# Patient Record
Sex: Male | Born: 1965 | Race: Black or African American | Hispanic: No | Marital: Single | Smoking: Former smoker
Health system: Southern US, Community
[De-identification: ages and names within clinical notes are randomized; demographics above are authoritative.]

---

## 2007-10-11 ENCOUNTER — Emergency Department: Payer: Self-pay | Admitting: Emergency Medicine

## 2014-02-28 ENCOUNTER — Ambulatory Visit: Payer: Self-pay | Admitting: Family Medicine

## 2014-09-14 ENCOUNTER — Ambulatory Visit (INDEPENDENT_AMBULATORY_CARE_PROVIDER_SITE_OTHER): Payer: Self-pay | Admitting: Endocrinology

## 2014-09-14 ENCOUNTER — Encounter: Payer: Self-pay | Admitting: Endocrinology

## 2014-09-14 VITALS — BP 122/68 | HR 112 | Temp 98.7°F | Ht 72.0 in | Wt 205.0 lb

## 2014-09-14 DIAGNOSIS — E0590 Thyrotoxicosis, unspecified without thyrotoxic crisis or storm: Secondary | ICD-10-CM

## 2014-09-14 MED ORDER — METOPROLOL TARTRATE 25 MG PO TABS: 25.0000 mg | ORAL_TABLET | Freq: Two times a day (BID) | ORAL | Status: DC

## 2014-09-14 MED ORDER — METHIMAZOLE 10 MG PO TABS: 20.0000 mg | ORAL_TABLET | Freq: Two times a day (BID) | ORAL | Status: DC

## 2014-09-14 NOTE — Progress Notes (Signed)
Subjective:    Patient ID: Colin [name], male    DOB: 1965/07/06, 49 y.o.   MRN: [id number]  HPI Pt reports few mos of moderate muscle weakness throughout the body, and assoc fatigue.  He has no prior h/o thyroid probs, and has never been on therapy for this.  He has never had XRT to the anterior neck, or thyroid surgery.  He has never had thyroid imaging.  He does not consume kelp or any other prescribed or non-prescribed thyroid medication.  He has never been on amiodarone.   No past medical history on file.  No past surgical history on file.  Social History   Social History  . Marital Status: Single    Spouse Name: N/A  . Number of Children: N/A  . Years of Education: N/A   Occupational History  . Not on file.   Social History Main Topics  . Smoking status: Former Games developer  . Smokeless tobacco: Not on file  . Alcohol Use: 0.0 oz/week    0 Standard drinks or equivalent per week  . Drug Use: Not on file  . Sexual Activity: Not on file   Other Topics Concern  . Not on file   Social History Narrative  . No narrative on file    No current outpatient prescriptions on file prior to visit.   No current facility-administered medications on file prior to visit.    Allergies  Allergen Reactions  . Ibuprofen Swelling    Swelling in the throat     Family History  Problem Relation Age of Onset  . Thyroid disease Sister   . Thyroid disease Mother   . Thyroid disease Maternal Grandmother     BP 122/68 mmHg  Pulse 112  Temp(Src) 98.7 F (37.1 C) (Oral)  Ht 6' (1.829 m)  Wt 205 lb (92.987 kg)  BMI 27.80 kg/m2  SpO2 94%    Review of Systems denies hoarseness, double vision, diarrhea, myalgias, excessive diaphoresis, easy bruising, and rhinorrhea.  He has insomnia, tremor, palpitations, nocturia, doe, heat intolerance, and headache.  He has lost 28 lbs x 5 mos.     Objective:   Physical Exam VS: see vs page GEN: no distress HEAD: head: no deformity eyes:  no periorbital swelling, no proptosis external nose and ears are normal mouth: no lesion seen NECK: thyroid is 5-10 times normal size, R>L. CHEST WALL: no deformity LUNGS: clear to auscultation BREASTS:  No gynecomastia CV: tachycardic rate but normal rhythm, no murmur ABD: abdomen is soft, nontender.  no hepatosplenomegaly.  not distended.  no hernia MUSCULOSKELETAL: muscle bulk and strength are grossly normal.  no obvious joint swelling.  gait is normal and steady EXTEMITIES: no deformity.  no ulcer on the feet.  feet are of normal color and temp.  no edema PULSES: dorsalis pedis intact bilat.  no carotid bruit NEURO:  cn 2-12 grossly intact.   readily moves all 4's.  sensation is intact to touch on the feet.  Moderate tremor of the hands SKIN:  Normal texture and temperature.  No rash or suspicious lesion is visible.  Not diaphoretic NODES:  None palpable at the neck PSYCH: alert, well-oriented.  Does not appear anxious nor depressed.   outside test results are reviewed: TSH is undetectable  I have reviewed outside records: pt was noted to be clinically hyperthyroid, so appt date here was moved up.      Assessment & Plan:  Hyperthyroidism: severe exacerbation.  Due to severity, he is not  a candidate for I-131 rx now.   Muscle weakness, new, prob due to hyperthyroidism.  We'll follow this Tachycardia: due to hyperthyroidism:  We'll rx with b-blocker for now, but we'll probably be able to taper off as hyperthyroidism improves with rx.    Patient is advised the following: Patient Instructions  i have sent 2 prescriptions to your pharmacy, to slow the thyroid, and another to slow the heart rate.   if ever you have fever while taking methimazole, stop it and call us, because of the risk of a rare side-effect.   Please come back for a follow-up appointment in 3-4 weeks.     Hyperthyroidism The thyroid is a large gland located in the lower front part of your neck. The thyroid helps  control metabolism. Metabolism is how your body uses food. It controls metabolism with the hormone thyroxine. When the thyroid is overactive, it produces too much hormone. When this happens, these following problems may occur:   Nervousness  Heat intolerance  Weight loss (in spite of increase food intake)  Diarrhea  Change in hair or skin texture  Palpitations (heart skipping or having extra beats)  Tachycardia (rapid heart rate)  Loss of menstruation (amenorrhea)  Shaking of the hands CAUSES  Grave's Disease (the immune system attacks the thyroid gland). This is the most common cause.  Inflammation of the thyroid gland.  Tumor (usually benign) in the thyroid gland or elsewhere.  Excessive use of thyroid medications (both prescription and 'natural').  Excessive ingestion of Iodine. DIAGNOSIS  To prove hyperthyroidism, your caregiver may do blood tests and ultrasound tests. Sometimes the signs are hidden. It may be necessary for your caregiver to watch this illness with blood tests, either before or after diagnosis and treatment. TREATMENT Short-term treatment There are several treatments to control symptoms. Drugs called beta blockers may give some relief. Drugs that decrease hormone production will provide temporary relief in many people. These measures will usually not give permanent relief. Definitive therapy There are treatments available which can be discussed between you and your caregiver which will permanently treat the problem. These treatments range from surgery (removal of the thyroid), to the use of radioactive iodine (destroys the thyroid by radiation), to the use of antithyroid drugs (interfere with hormone synthesis). The first two treatments are permanent and usually successful. They most often require hormone replacement therapy for life. This is because it is impossible to remove or destroy the exact amount of thyroid required to make a person euthyroid  (normal). HOME CARE INSTRUCTIONS  See your caregiver if the problems you are being treated for get worse. Examples of this would be the problems listed above. SEEK MEDICAL CARE IF: Your general condition worsens. MAKE SURE YOU:   Understand these instructions.  Will watch your condition.  Will get help right away if you are not doing well or get worse. Document Released: 12/24/2004 Document Revised: 03/18/2011 Document Reviewed: 05/07/2006 [facility] Patient Information 2015 [place], [place]. This information is not intended to replace advice given to you by your health care provider. Make sure you discuss any questions you have with your health care provider.

## 2014-09-14 NOTE — Patient Instructions (Addendum)
i have sent 2 prescriptions to your pharmacy, to slow the thyroid, and another to slow the heart rate.   if ever you have fever while taking methimazole, stop it and call us, because of the risk of a rare side-effect.   Please come back for a follow-up appointment in 3-4 weeks.     Hyperthyroidism The thyroid is a large gland located in the lower front part of your neck. The thyroid helps control metabolism. Metabolism is how your body uses food. It controls metabolism with the hormone thyroxine. When the thyroid is overactive, it produces too much hormone. When this happens, these following problems may occur:   Nervousness  Heat intolerance  Weight loss (in spite of increase food intake)  Diarrhea  Change in hair or skin texture  Palpitations (heart skipping or having extra beats)  Tachycardia (rapid heart rate)  Loss of menstruation (amenorrhea)  Shaking of the hands CAUSES  Grave's Disease (the immune system attacks the thyroid gland). This is the most common cause.  Inflammation of the thyroid gland.  Tumor (usually benign) in the thyroid gland or elsewhere.  Excessive use of thyroid medications (both prescription and 'natural').  Excessive ingestion of Iodine. DIAGNOSIS  To prove hyperthyroidism, your caregiver may do blood tests and ultrasound tests. Sometimes the signs are hidden. It may be necessary for your caregiver to watch this illness with blood tests, either before or after diagnosis and treatment. TREATMENT Short-term treatment There are several treatments to control symptoms. Drugs called beta blockers may give some relief. Drugs that decrease hormone production will provide temporary relief in many people. These measures will usually not give permanent relief. Definitive therapy There are treatments available which can be discussed between you and your caregiver which will permanently treat the problem. These treatments range from surgery (removal of the  thyroid), to the use of radioactive iodine (destroys the thyroid by radiation), to the use of antithyroid drugs (interfere with hormone synthesis). The first two treatments are permanent and usually successful. They most often require hormone replacement therapy for life. This is because it is impossible to remove or destroy the exact amount of thyroid required to make a person euthyroid (normal). HOME CARE INSTRUCTIONS  See your caregiver if the problems you are being treated for get worse. Examples of this would be the problems listed above. SEEK MEDICAL CARE IF: Your general condition worsens. MAKE SURE YOU:   Understand these instructions.  Will watch your condition.  Will get help right away if you are not doing well or get worse. Document Released: 12/24/2004 Document Revised: 03/18/2011 Document Reviewed: 05/07/2006 [facility] Patient Information 2015 [place], [place]. This information is not intended to replace advice given to you by your health care provider. Make sure you discuss any questions you have with your health care provider.

## 2014-09-15 ENCOUNTER — Telehealth: Payer: Self-pay | Admitting: Endocrinology

## 2014-09-15 DIAGNOSIS — E0590 Thyrotoxicosis, unspecified without thyrotoxic crisis or storm: Secondary | ICD-10-CM | POA: Insufficient documentation

## 2014-09-15 NOTE — Telephone Encounter (Signed)
Please add pcp (from outside records) to pcp field, and also please send my note to pcp

## 2014-09-16 NOTE — Telephone Encounter (Signed)
PCP Jarrett Soho) added to pt's chart. Office note faxed.

## 2014-09-23 ENCOUNTER — Ambulatory Visit: Payer: Self-pay | Admitting: Endocrinology

## 2014-09-27 ENCOUNTER — Ambulatory Visit: Payer: Self-pay | Admitting: Endocrinology

## 2014-10-05 ENCOUNTER — Ambulatory Visit: Payer: Self-pay | Admitting: Endocrinology

## 2014-10-10 ENCOUNTER — Ambulatory Visit: Payer: Self-pay | Admitting: Endocrinology

## 2014-10-31 ENCOUNTER — Other Ambulatory Visit: Payer: Self-pay | Admitting: Endocrinology

## 2014-10-31 ENCOUNTER — Telehealth: Payer: Self-pay | Admitting: Endocrinology

## 2014-10-31 NOTE — Telephone Encounter (Signed)
[name] please see below

## 2014-10-31 NOTE — Telephone Encounter (Signed)
Patient called stating that he needs refills on his Rx's   Rx: Methimazole methoprolol   Pharmacy: [facility]    Thank you

## 2014-12-14 ENCOUNTER — Other Ambulatory Visit: Payer: Self-pay | Admitting: Endocrinology

## 2015-01-16 ENCOUNTER — Ambulatory Visit: Payer: Self-pay | Admitting: Endocrinology

## 2015-01-24 ENCOUNTER — Ambulatory Visit: Payer: Self-pay | Admitting: Endocrinology

## 2015-01-27 ENCOUNTER — Encounter: Payer: Self-pay | Admitting: Endocrinology

## 2015-01-27 ENCOUNTER — Ambulatory Visit (INDEPENDENT_AMBULATORY_CARE_PROVIDER_SITE_OTHER): Payer: Self-pay | Admitting: Endocrinology

## 2015-01-27 VITALS — BP 112/60 | HR 65 | Temp 98.2°F | Ht 72.0 in | Wt 239.0 lb

## 2015-01-27 DIAGNOSIS — R0603 Acute respiratory distress: Secondary | ICD-10-CM

## 2015-01-27 DIAGNOSIS — E0590 Thyrotoxicosis, unspecified without thyrotoxic crisis or storm: Secondary | ICD-10-CM

## 2015-01-27 DIAGNOSIS — Z91013 Allergy to seafood: Secondary | ICD-10-CM

## 2015-01-27 DIAGNOSIS — R0600 Dyspnea, unspecified: Secondary | ICD-10-CM | POA: Insufficient documentation

## 2015-01-27 MED ORDER — METOPROLOL SUCCINATE ER 25 MG PO TB24: 25.0000 mg | ORAL_TABLET | Freq: Every day | ORAL | Status: DC

## 2015-01-27 NOTE — Patient Instructions (Addendum)
Please reduce the metoprolol to 25 mg daily. i have sent a prescription to your pharmacy, for the once-a-day form. blood tests are requested for you today.  We'll let you know about the results.  Based on the results, we'll adjust the methimazole. When you get insurance, we can consider the radioactive iodine pill. Please see an allergy specialist.  you will receive a phone call, about a day and time for an appointment Please come back for a follow-up appointment in 1 month.

## 2015-01-27 NOTE — Progress Notes (Signed)
   Subjective:    Patient ID: Colin [name], male    DOB: 07-Jun-1965, 50 y.o.   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  he is overdue for f/u.  He has moderate swelling at the anterior neck, but no assoc pain.     Review of Systems He has intermittent sob (with lying supine).  Denies fever.  He reports shellfish allergy, and requests to see a specislist    Objective:   Physical Exam VITAL SIGNS:  See vs page GENERAL: no distress NECK: Thyroid is 10 times normal size, diffuse.  LUNGS:  Clear to auscultation   Lab Results  Component Value Date   TSH 42.715* 01/27/2015   i personally reviewed spirometry tracing (today): Indication: dyspnea Impression: normal    Assessment & Plan:  Hyperthyroidism, overcontrolled, due to noncompliance with f/u Dyspnea: new to me not thyroid-related.   Shellfish allergy.   Patient is advised the following: Patient Instructions  Please reduce the metoprolol to 25 mg daily. i have sent a prescription to your pharmacy, for the once-a-day form. blood tests are requested for you today.  We'll let you know about the results.  Based on the results, we'll adjust the methimazole. When you get insurance, we can consider the radioactive iodine pill. Please see an allergy specialist.  you will receive a phone call, about a day and time for an appointment Please come back for a follow-up appointment in 1 month.   addendum: d/c tapazole, Please come back for a follow-up appointment in 1 month.

## 2015-01-28 LAB — TSH: TSH: 42.715 u[IU]/mL — AB (ref 0.350–4.500)

## 2015-01-28 LAB — T4, FREE: Free T4: 0.13 ng/dL — ABNORMAL LOW (ref 0.80–1.80)

## 2015-02-13 ENCOUNTER — Other Ambulatory Visit: Payer: Self-pay | Admitting: Endocrinology

## 2015-02-27 ENCOUNTER — Ambulatory Visit (INDEPENDENT_AMBULATORY_CARE_PROVIDER_SITE_OTHER): Payer: Self-pay | Admitting: Internal Medicine

## 2015-02-27 ENCOUNTER — Ambulatory Visit (INDEPENDENT_AMBULATORY_CARE_PROVIDER_SITE_OTHER): Payer: Self-pay | Admitting: Endocrinology

## 2015-02-27 ENCOUNTER — Encounter: Payer: Self-pay | Admitting: Internal Medicine

## 2015-02-27 ENCOUNTER — Encounter (INDEPENDENT_AMBULATORY_CARE_PROVIDER_SITE_OTHER): Payer: Self-pay

## 2015-02-27 ENCOUNTER — Encounter: Payer: Self-pay | Admitting: Endocrinology

## 2015-02-27 VITALS — BP 134/82 | HR 59 | Ht 72.0 in | Wt 241.0 lb

## 2015-02-27 VITALS — BP 122/80 | HR 74 | Temp 98.1°F | Ht 73.0 in | Wt 241.0 lb

## 2015-02-27 DIAGNOSIS — R0600 Dyspnea, unspecified: Secondary | ICD-10-CM

## 2015-02-27 DIAGNOSIS — E0590 Thyrotoxicosis, unspecified without thyrotoxic crisis or storm: Secondary | ICD-10-CM

## 2015-02-27 LAB — TSH: TSH: 36.71 u[IU]/mL — ABNORMAL HIGH (ref 0.35–4.50)

## 2015-02-27 LAB — T4, FREE

## 2015-02-27 NOTE — Patient Instructions (Signed)
blood tests are requested for you today.  We'll let you know about the results.  If it is high again, I'll request the radioactive iodine.  It works like this:  We would first check a thyroid "scan" (a special, but easy and painless type of thyroid x ray): you go to the x-ray department of the hospital to swallow a pill, which contains a miniscule amount of radiation.  You will not notice any symptoms from this.  You will go back to the x-ray department the next day, to lie down in front of a camera.  The results of this will be sent to me.   Based on the results, i hope to order for you a treatment pill of radioactive iodine.  Although it is a larger amount of radiation, you will again notice no symptoms from this.  The pill is gone from your body in a few days (during which you should stay away from other people), but takes several months to work.  Therefore, please return here approximately 6-8 weeks after the treatment.  This treatment has been available for many years, and the only known side-effect is an underactive thyroid.  It is possible that i would eventually prescribe for you a thyroid hormone pill, which is very inexpensive.  You don't have to worry about side-effects of this thyroid hormone pill, because it is the same molecule your thyroid makes.

## 2015-02-27 NOTE — Patient Instructions (Signed)
Try prilosec otc   Take 30-60 min before first meal of the day and Pepcid ac (famotidine) 20 mg one @  bedtime until cough is completely gone for at least a week without the need for cough suppression    GERD (REFLUX)  is an extremely common cause of respiratory symptoms just like yours , many times with no obvious heartburn at all.    It can be treated with medication, but also with lifestyle changes including elevation of the head of your bed (ideally with 6 inch  bed blocks),  Smoking cessation, avoidance of late meals, excessive alcohol, and avoid fatty foods, chocolate, peppermint, colas, red wine, and acidic juices such as orange juice.  NO MINT OR MENTHOL PRODUCTS SO NO COUGH DROPS  USE SUGARLESS CANDY INSTEAD (Jolley ranchers or Stover's or Life Savers) or even ice chips will also do - the key is to swallow to prevent all throat clearing. NO OIL BASED VITAMINS - use powdered substitutes.    Call if cough not improving in 2 weeks and I can refer you back to [facility] where they started your work up but this time send you to the pulmonary division

## 2015-02-27 NOTE — Progress Notes (Signed)
Subjective:    Patient ID: Colin [name], male    DOB: 15-Sep-1965,    MRN: [id number]  HPI  49 yobm quit smoking 02/2013 with cough and sensation of migratory sensation of something stuck in throat/back ever since inhaled from cracked bong in 2010 prev eval in [place], [place], [facility] including ENT dept.with neg w/u (including laryngoscopy)  and Dr Ramonita Lab referred to pulmonary clinic 02/27/2015   02/27/2015 1st [facility] office visit/ Davie Claud   Chief Complaint  Patient presents with  . Pulmonary Consult    Referred by Dr. Everardo All. Pt c/o SOB for the past 4-5 yrs- started after he inhaled glass while smoking maryjuana. 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   sensation 24/7 aware of sensation of something stuck in this throat (points to suprasternal notch)  Cough comes and goes in terms of severity/ better since quit smoking/ cough worse at hs every single night x 7 years Sob also when lie down s change in pattern x 7 years     No obvious day to day or daytime variability or assoc excess/ purulent sputum or mucus plugs or hemoptysis or cp or chest tightness, subjective wheeze or overt sinus or hb symptoms. No unusual exp hx or h/o childhood pna/ asthma or knowledge of premature birth.  Sleeping ok without nocturnal  or early am exacerbation  of respiratory  c/o's or need for noct saba. Also denies any obvious fluctuation of symptoms with weather or environmental changes or other aggravating or alleviating factors except as outlined above   Current Medications, Allergies, Complete Past Medical History, Past Surgical History, Family History, and Social History were reviewed in [employer] record.  ROS  The following are not active complaints unless bolded sore throat, dysphagia, dental problems, itching, sneezing,  nasal congestion or excess/ purulent secretions, ear ache,   fever, chills, sweats,  unintended wt loss, classically pleuritic or exertional cp,  orthopnea pnd or leg swelling, presyncope, palpitations, abdominal pain, anorexia, nausea, vomiting, diarrhea  or change in bowel or bladder habits, change in stools or urine, dysuria,hematuria,  rash, arthralgias, visual complaints, headache, numbness, weakness or ataxia or problems with walking or coordination,  change in mood/affect or memory.          Review of Systems  Constitutional: Negative for fever, chills, activity change, appetite change and unexpected weight change.  HENT: Positive for dental problem and trouble swallowing. Negative for congestion, postnasal drip, rhinorrhea, sneezing, sore throat and voice change.   Eyes: Negative for visual disturbance.  Respiratory: Positive for shortness of breath. Negative for cough and choking.   Cardiovascular: Negative for chest pain and leg swelling.  Gastrointestinal: Negative for nausea, vomiting and abdominal pain.  Genitourinary: Negative for difficulty urinating.  Musculoskeletal: Negative for arthralgias.  Skin: Negative for rash.  Psychiatric/Behavioral: Negative for behavioral problems and confusion.       Objective:   Physical Exam  amb bm rambling hx/ pseudowheeze only   Wt Readings from Last 3 Encounters:  02/27/15 241 lb (109.317 kg)  01/27/15 239 lb (108.41 kg)  09/14/14 205 lb (92.987 kg)    Vital signs reviewed  HEENT: nl dentition, turbinates, and oropharynx. Nl external ear canals without cough reflex   NECK :  without JVD/Nodes/TM/ nl carotid upstrokes bilaterally   LUNGS: no acc muscle use,  Nl contour chest which is clear to A and P bilaterally without cough on  insp or exp maneuvers   CV:  RRR  no s3 or murmur or increase in P2, no edema   ABD:  soft and nontender with nl inspiratory excursion in the supine position. No bruits or organomegaly, bowel sounds nl  MS:  Nl gait/ ext warm without deformities, calf tenderness, cyanosis or  clubbing No obvious joint restrictions   SKIN: warm and dry without lesions    NEURO:  alert, approp, nl sensorium with  no motor deficits     cxr within the last few months normal at [facility] per pt per Jarrett Soho        Assessment & Plan:

## 2015-02-27 NOTE — Progress Notes (Signed)
Subjective:    Patient ID: Colin [name], male    DOB: 04-06-1965, 50 y.o.   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  Since off the tapazole, pt states he feels well in general.  He wants to take I-131 rx.   No past medical history on file.  No past surgical history on file.  Social History   Social History  . Marital Status: Single    Spouse Name: N/A  . Number of Children: N/A  . Years of Education: N/A   Occupational History  . truck driver    Social History Main Topics  . Smoking status: Former Smoker -- 0.50 packs/day for 10 years    Types: Cigarettes    Quit date: 02/07/2013  . Smokeless tobacco: Never Used  . Alcohol Use: 0.0 oz/week    0 Standard drinks or equivalent per week  . Drug Use: No  . Sexual Activity: Not on file   Other Topics Concern  . Not on file   Social History Narrative    Current Outpatient Prescriptions on File Prior to Visit  Medication Sig Dispense Refill  . metoprolol succinate (TOPROL-XL) 25 MG 24 hr tablet Take 1 tablet (25 mg total) by mouth daily. 30 tablet 1   No current facility-administered medications on file prior to visit.    Allergies  Allergen Reactions  . Ibuprofen Swelling    Swelling in the throat   . Shellfish Allergy     Family History  Problem Relation Age of Onset  . Thyroid disease Sister   . Thyroid disease Mother   . Thyroid disease Maternal Grandmother   . Asthma Son   . Allergies Son     BP 122/80 mmHg  Pulse 74  Temp(Src) 98.1 F (36.7 C) (Oral)  Ht  (1.854 m)  Wt 241 lb (109.317 kg)  BMI 31.80 kg/m2  SpO2 95%   Review of Systems He has gained a few lbs.     Objective:   Physical Exam VITAL SIGNS:  See vs page GENERAL: no  distress NECK: Thyroid is 10 times normal size, diffuse.   Lab Results  Component Value Date   TSH 36.71* 02/27/2015       Assessment & Plan:  Hyperthyroidism: has not yet recurred off tapazole  Patient is advised the following: Patient Instructions  blood tests are requested for you today.  We'll let you know about the results.  If it is high again, I'll request the radioactive iodine.  It works like this:  We would first check a thyroid "scan" (a special, but easy and painless type of thyroid x ray): you go to the x-ray department of the hospital to swallow a pill, which contains a miniscule amount of radiation.  You will not notice any symptoms from this.  You will go back to the x-ray department the next day, to lie down in front of a camera.  The results of this will be sent to me.   Based on the results, i hope to order for you a treatment pill of radioactive iodine.  Although it is a larger amount of radiation, you will again notice no symptoms from this.  The pill is gone from your body  in a few days (during which you should stay away from other people), but takes several months to work.  Therefore, please return here approximately 6-8 weeks after the treatment.  This treatment has been available for many years, and the only known side-effect is an underactive thyroid.  It is possible that i would eventually prescribe for you a thyroid hormone pill, which is very inexpensive.  You don't have to worry about side-effects of this thyroid hormone pill, because it is the same molecule your thyroid makes.   addendum: stay off tapazole.  Please come back for a follow-up appointment in 1 month.

## 2015-02-28 NOTE — Assessment & Plan Note (Addendum)
Symptoms are markedly disproportionate to objective findings and not clear this is a lung problem but pt does appear to have difficult airway management issues. DDX of  difficult airways management almost all start with A and  include Adherence, Ace Inhibitors, Acid Reflux, Active Sinus Disease, Alpha 1 Antitripsin deficiency, Anxiety masquerading as Airways dz,  ABPA,  Allergy(esp in young), Aspiration (esp in elderly), Adverse effects of meds,  Active smokers, A bunch of PE's (a small clot burden can't cause this syndrome unless there is already severe underlying pulm or vascular dz with poor reserve) plus two Bs  = Bronchiectasis and Beta blocker use..and one C= CHF   Adherence is always the initial "prime suspect" and is a multilayered concern that requires a "trust but verify" approach in every patient - starting with knowing how to use medications, especially inhalers, correctly, keeping up with refills and understanding the fundamental difference between maintenance and prns vs those medications only taken for a very short course and then stopped and not refilled.   ? Acid (or non-acid) GERD > always difficult to exclude as up to 75% of pts in some series report no assoc GI/ Heartburn symptoms> rec max (24h)  acid suppression and diet restrictions/ reviewed and instructions given in writing.   ? Anxiety > usually dx of exclusion but note this pt has had eval for same in 3 different medical centers (and this is the 4th) so unlikely to uncover a "retained FB" p 7 years with a reported nl cxr w/in a few weeks of this ov  I had an extended discussion with the patient reviewing all relevant studies completed to date and  Lasting 20 minutes of a 30 minute visit    Each maintenance medication was reviewed in detail including most importantly the difference between maintenance and prns and under what circumstances the prns are to be triggered using an action plan format that is not reflected in the  computer generated alphabetically organized AVS.    Please see instructions for details which were reviewed in writing and the patient given a copy highlighting the part that I personally wrote and discussed at today's ov.

## 2015-05-05 ENCOUNTER — Ambulatory Visit (INDEPENDENT_AMBULATORY_CARE_PROVIDER_SITE_OTHER): Payer: Self-pay | Admitting: Endocrinology

## 2015-05-05 ENCOUNTER — Encounter: Payer: Self-pay | Admitting: Endocrinology

## 2015-05-05 VITALS — BP 122/70 | HR 77 | Temp 98.3°F | Ht 73.0 in | Wt 220.0 lb

## 2015-05-05 DIAGNOSIS — E0590 Thyrotoxicosis, unspecified without thyrotoxic crisis or storm: Secondary | ICD-10-CM

## 2015-05-05 LAB — TSH: TSH: 0.07 u[IU]/mL — AB (ref 0.35–4.50)

## 2015-05-05 LAB — T4, FREE: FREE T4: 1 ng/dL (ref 0.60–1.60)

## 2015-05-05 NOTE — Patient Instructions (Addendum)
blood tests are requested for you today.  We'll let you know about the results.    Please hold off on the methimazole medication for now.

## 2015-05-05 NOTE — Progress Notes (Signed)
   Subjective:    Patient ID: Colin [name], male    DOB: September 25, 1965, 50 y.o.   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  Pt says 1 week ago, he resumed tapazole 10 mg qd, due to fatigue.   No past medical history on file.  No past surgical history on file.  Social History   Social History  . Marital Status: Single    Spouse Name: N/A  . Number of Children: N/A  . Years of Education: N/A   Occupational History  . truck driver    Social History Main Topics  . Smoking status: Former Smoker -- 0.50 packs/day for 10 years    Types: Cigarettes    Quit date: 02/07/2013  . Smokeless tobacco: Never Used  . Alcohol Use: 0.0 oz/week    0 Standard drinks or equivalent per week  . Drug Use: No  . Sexual Activity: Not on file   Other Topics Concern  . Not on file   Social History Narrative    Current Outpatient Prescriptions on File Prior to Visit  Medication Sig Dispense Refill  . metoprolol succinate (TOPROL-XL) 25 MG 24 hr tablet Take 1 tablet (25 mg total) by mouth daily. 30 tablet 1   No current facility-administered medications on file prior to visit.    Allergies  Allergen Reactions  . Ibuprofen Swelling    Swelling in the throat   . Shellfish Allergy     Family History  Problem Relation Age of Onset  . Thyroid disease Sister   . Thyroid disease Mother   . Thyroid disease Maternal Grandmother   . Asthma Son   . Allergies Son     BP 122/70 mmHg  Pulse 77  Temp(Src) 98.3 F (36.8 C) (Oral)  Ht 6\' 1"  (1.854 m)  Wt 220 lb (99.791 kg)  BMI 29.03 kg/m2  SpO2 98%  Review of Systems Denies fever.     Objective:   Physical Exam VITAL SIGNS:  See vs page.  GENERAL: no  distress.  NECK: Thyroid is 10 times normal size, diffuse.   Lab Results  Component Value Date   TSH 0.07* 05/05/2015      Assessment & Plan:  Hyperthyroidism: recurrent off tapazole.  Plan is for I-131 rx  Patient is advised the following: Patient Instructions  blood tests are requested for you today.  We'll let you know about the results.    Please hold off on the methimazole medication for now.     addendum: hyperthyroidism has recurred.  i ordered scan.

## 2015-05-08 ENCOUNTER — Telehealth: Payer: Self-pay | Admitting: Endocrinology

## 2015-05-08 NOTE — Telephone Encounter (Signed)
PT said he was returning call about lab results

## 2015-05-08 NOTE — Telephone Encounter (Signed)
Called pt and advised him per Dr Henderson CloudEllisons result note.

## 2015-05-17 ENCOUNTER — Ambulatory Visit (HOSPITAL_COMMUNITY): Payer: Self-pay

## 2015-05-18 ENCOUNTER — Other Ambulatory Visit (HOSPITAL_COMMUNITY): Payer: Self-pay

## 2015-05-29 ENCOUNTER — Ambulatory Visit (HOSPITAL_COMMUNITY)
Admission: RE | Admit: 2015-05-29 | Discharge: 2015-05-29 | Disposition: A | Discharge status: Home / Self Care | Payer: Self-pay | Source: Ambulatory Visit | Attending: Endocrinology | Admitting: Endocrinology

## 2015-05-29 DIAGNOSIS — E0590 Thyrotoxicosis, unspecified without thyrotoxic crisis or storm: Secondary | ICD-10-CM | POA: Insufficient documentation

## 2015-05-30 ENCOUNTER — Encounter (HOSPITAL_COMMUNITY)
Admission: RE | Admit: 2015-05-30 | Discharge: 2015-05-30 | Disposition: A | Discharge status: Home / Self Care | Payer: Self-pay | Source: Ambulatory Visit | Attending: Endocrinology | Admitting: Endocrinology

## 2015-05-30 MED ORDER — SODIUM PERTECHNETATE TC 99M INJECTION
10.0000 | Freq: Once | INTRAVENOUS | Status: AC | PRN
  Administered 2015-05-30: 10 via INTRAVENOUS

## 2015-06-26 ENCOUNTER — Other Ambulatory Visit: Payer: Self-pay

## 2015-06-26 ENCOUNTER — Telehealth: Payer: Self-pay | Admitting: Endocrinology

## 2015-06-26 DIAGNOSIS — R0600 Dyspnea, unspecified: Secondary | ICD-10-CM

## 2015-06-26 DIAGNOSIS — E0590 Thyrotoxicosis, unspecified without thyrotoxic crisis or storm: Secondary | ICD-10-CM

## 2015-06-26 DIAGNOSIS — R0603 Acute respiratory distress: Secondary | ICD-10-CM

## 2015-06-26 DIAGNOSIS — Z91013 Allergy to seafood: Secondary | ICD-10-CM

## 2015-06-26 MED ORDER — METOPROLOL SUCCINATE ER 25 MG PO TB24: 25.0000 mg | ORAL_TABLET | Freq: Every day | ORAL | Status: DC

## 2015-06-26 NOTE — Telephone Encounter (Signed)
Left message for patient requested  medication called to pharmacy.

## 2015-06-26 NOTE — Telephone Encounter (Signed)
Call in refills for [facility]imazole [facility] has one day supply left

## 2015-07-04 ENCOUNTER — Telehealth: Payer: Self-pay

## 2015-07-04 MED ORDER — METHIMAZOLE 10 MG PO TABS: 20.0000 mg | ORAL_TABLET | Freq: Two times a day (BID) | ORAL | Status: DC

## 2015-07-04 NOTE — Telephone Encounter (Signed)
Pt called about his RAI. Pt stated he he is not going to be able and have the RAI until the last week for July, first week of August. Pt wanted to know if we should submit another prescription for methimazole? Could you review and please advise during Dr. George HughEllison's absence? Thanks!

## 2015-07-04 NOTE — Telephone Encounter (Signed)
He can continue the same dose of methimazole he was taking

## 2015-07-04 NOTE — Telephone Encounter (Signed)
Attempted to reach the pt. Pt was unavailable. Will try again at a later time.  

## 2015-07-05 NOTE — Telephone Encounter (Signed)
Attempted to reach the pt. Pt was unavailable. Will try again at a later time.  

## 2015-07-05 NOTE — Telephone Encounter (Signed)
I contacted the pt and advised we had sent a prescription for the methimazole.

## 2015-07-26 ENCOUNTER — Other Ambulatory Visit: Payer: Self-pay | Admitting: Endocrinology

## 2015-07-26 NOTE — Telephone Encounter (Signed)
Not my patient

## 2015-09-16 ENCOUNTER — Telehealth: Payer: Self-pay | Admitting: Endocrinology

## 2015-09-16 ENCOUNTER — Other Ambulatory Visit: Payer: Self-pay | Admitting: Endocrinology

## 2015-09-16 NOTE — Telephone Encounter (Signed)
Please refill x 1 Ov is due  

## 2015-09-18 NOTE — Telephone Encounter (Signed)
Refill for methimazole sent.

## 2015-10-04 ENCOUNTER — Telehealth: Payer: Self-pay | Admitting: Endocrinology

## 2015-10-04 MED ORDER — METHIMAZOLE 10 MG PO TABS: 20.0000 mg | ORAL_TABLET | Freq: Two times a day (BID) | ORAL | 2 refills | Status: DC

## 2015-10-04 NOTE — Telephone Encounter (Signed)
I contacted the patient advised we have refilled his [facility] to [facility]. Patient voiced understanding.

## 2015-10-04 NOTE — Telephone Encounter (Signed)
Rx for the thyroid med needs to be called into [facility] Needs this immediately leaving on plane in 4 hours and only has 2 pills left

## 2015-10-23 ENCOUNTER — Ambulatory Visit: Payer: Self-pay | Admitting: Endocrinology

## 2016-03-11 ENCOUNTER — Telehealth: Payer: Self-pay | Admitting: Endocrinology

## 2016-03-11 NOTE — Telephone Encounter (Signed)
Refill of  methimazole (TAPAZOLE) 10 MG tablet 120 tablet   With refill  [facility] [address] - [place], [place] - [id number] [phone] (Phone) [phone] (Fax)

## 2016-03-25 ENCOUNTER — Ambulatory Visit: Payer: Self-pay | Admitting: Endocrinology

## 2016-03-26 ENCOUNTER — Telehealth: Payer: Self-pay | Admitting: Endocrinology

## 2016-03-26 NOTE — Telephone Encounter (Signed)
Patient no showed today's appt. Please advise on how to follow up. °A. No follow up necessary. °B. Follow up urgent. Contact patient immediately. °C. Follow up necessary. Contact patient and schedule visit in ___ days. °D. Follow up advised. Contact patient and schedule visit in ____weeks. ° °

## 2016-03-26 NOTE — Telephone Encounter (Signed)
Please refill x 1 Ov is due Last refill

## 2016-03-27 MED ORDER — METHIMAZOLE 10 MG PO TABS: 20.0000 mg | ORAL_TABLET | Freq: Two times a day (BID) | ORAL | 0 refills | Status: DC

## 2016-03-27 NOTE — Telephone Encounter (Signed)
Refill submitted. 

## 2016-07-12 ENCOUNTER — Telehealth: Payer: Self-pay | Admitting: Endocrinology

## 2016-07-12 MED ORDER — METHIMAZOLE 10 MG PO TABS: 20.0000 mg | ORAL_TABLET | Freq: Two times a day (BID) | ORAL | 0 refills | Status: DC

## 2016-07-12 NOTE — Telephone Encounter (Signed)
**  Remind patient they can make refill requests via MyChart**  Medication refill request (Name & Dosage):  methimazole (TAPAZOLE) 10 MG tablet  Preferred pharmacy (Name & Address):  [facility] [address]- [place], [place] - [id number]   Other comments (if applicable):   Notify patient once rx has been placed

## 2016-07-12 NOTE — Telephone Encounter (Signed)
Med refilled and patient notified.

## 2016-07-16 ENCOUNTER — Ambulatory Visit: Payer: Self-pay | Admitting: Endocrinology

## 2016-09-23 ENCOUNTER — Telehealth: Payer: Self-pay | Admitting: Endocrinology

## 2016-09-23 ENCOUNTER — Other Ambulatory Visit: Payer: Self-pay | Admitting: Endocrinology

## 2016-09-23 NOTE — Telephone Encounter (Signed)
MEDICATION: methimazole (TAPAZOLE) 10 MG tablet  PHARMACY:   [facility] [address] [place], [place] - [address] [phone] (Phone) [phone] (Fax)     IS THIS A 90 DAY SUPPLY : Y  IS PATIENT OUT OF MEDICATION: N  IF NOT; HOW MUCH IS LEFT: 2 pills left  LAST APPOINTMENT DATE: 05/05/15  NEXT APPOINTMENT DATE: N/A  OTHER COMMENTS:    **Let patient know to contact pharmacy at the end of the day to make sure medication is ready. **  ** Please notify patient to allow 48-72 hours to process**  **Encourage patient to contact the pharmacy for refills or they can request refills through [facility]**

## 2016-09-23 NOTE — Telephone Encounter (Signed)
Please refill x 2 months 

## 2016-09-24 ENCOUNTER — Other Ambulatory Visit: Payer: Self-pay

## 2016-09-24 MED ORDER — METHIMAZOLE 10 MG PO TABS: ORAL_TABLET | ORAL | 2 refills | Status: AC

## 2016-10-07 ENCOUNTER — Encounter: Payer: Self-pay | Admitting: Endocrinology

## 2016-10-30 ENCOUNTER — Telehealth: Payer: Self-pay | Admitting: Endocrinology

## 2016-10-30 NOTE — Telephone Encounter (Signed)
Patient dismissed from [facility] by Romero BellingSean Ellison , effective October 07, 2016. Dismissal letter sent out by certified / registered mail.  daj

## 2016-11-01 ENCOUNTER — Encounter: Payer: Self-pay | Admitting: Endocrinology

## 2016-11-07 NOTE — Telephone Encounter (Signed)
Received signed domestic return receipt verifying delivery of certified letter on November 04, 2016. Article number [phone] daj

## 2017-10-03 ENCOUNTER — Other Ambulatory Visit: Payer: Self-pay | Admitting: Family Medicine

## 2017-10-03 DIAGNOSIS — N644 Mastodynia: Secondary | ICD-10-CM

## 2017-10-03 DIAGNOSIS — N62 Hypertrophy of breast: Secondary | ICD-10-CM

## 2018-11-17 ENCOUNTER — Other Ambulatory Visit: Payer: Self-pay | Admitting: Internal Medicine

## 2018-11-17 DIAGNOSIS — N62 Hypertrophy of breast: Secondary | ICD-10-CM

## 2019-01-12 ENCOUNTER — Ambulatory Visit
Admission: RE | Admit: 2019-01-12 | Discharge: 2019-01-12 | Disposition: A | Discharge status: Home / Self Care | Payer: No Typology Code available for payment source | Source: Ambulatory Visit | Attending: Internal Medicine | Admitting: Internal Medicine

## 2019-01-12 ENCOUNTER — Other Ambulatory Visit: Payer: Self-pay

## 2019-01-12 ENCOUNTER — Ambulatory Visit: Payer: Self-pay

## 2019-01-12 DIAGNOSIS — N62 Hypertrophy of breast: Secondary | ICD-10-CM

## 2020-12-19 ENCOUNTER — Ambulatory Visit (INDEPENDENT_AMBULATORY_CARE_PROVIDER_SITE_OTHER): Payer: Self-pay | Admitting: Internal Medicine

## 2020-12-19 ENCOUNTER — Other Ambulatory Visit: Payer: Self-pay

## 2020-12-19 ENCOUNTER — Encounter: Payer: Self-pay | Admitting: Internal Medicine

## 2020-12-19 VITALS — BP 138/76 | HR 90 | Ht 72.0 in | Wt 267.6 lb

## 2020-12-19 DIAGNOSIS — I4891 Unspecified atrial fibrillation: Secondary | ICD-10-CM

## 2020-12-19 NOTE — Progress Notes (Signed)
Cardiology Office Note:    Date:  12/19/2020   ID:  Colin [name], DOB 1965/11/06, MRN [id number]  PCP:  Colin Stalker, PA-C   [facility] Providers Cardiologist:  Colin Mayo, MD     Referring MD: Colin Loader, FNP   No chief complaint on file.  Atrial fibrillation  History of Present Illness:    Colin [name] is a 55 y.o. male with a hx of GERD, hyperthyroidism, referral from Colin [name] for new onset atrial fibrillation  Patient was told that he had diagnosis of atrial fibrillation from [facility] screening. This is a new diagnosis. He had an EKG 11/21/2020.  He feels fast hear rates sometimes. He feels heart racing. No hypertension. No diabetes.He was told he is borderline. No hx of CHF. No stroke. No hx of stress test. He reports he does snore. He at times gets some midsternal pain that feels sore. It feels sore. At rest mainly. No DOE. No orthopnea or PND. No LE edema.  Past Medical Hx: Hyperthyroidism seeing endocrinology and recommended to radioactive iodine pill. His TSH is 0.07. He's had significant fluctuations since 2017.  He takes methimazole 10 mg. Metoprolol XL 25 mg. He is on eliquis 5 mg started two weeks ; 12/2.  Family Hx: Father had cancer. Had heart shocking and his out of rhythm. Possible atrial fibrillation. MI. Thyroid dx in the family. His younger brother has heart problems and morbid obesity  Social Hx:  Smoker since 80; smoke occasionally. Long distance truck driver.  [facility] labs A1c - 5.8 % Crt 1.0   08/21/2020 LDL113   Current Medications: Current Meds  Medication Sig   apixaban (ELIQUIS) 5 MG TABS tablet 1 tablet   methimazole (TAPAZOLE) 10 MG tablet TAKE 2 TABLETS (20 MG TOTAL) BY MOUTH TWICE DAILY   metoprolol succinate (TOPROL-XL) 25 MG 24 hr tablet Take 1 tablet (25 mg total) by mouth daily.   sildenafil (REVATIO) 20 MG tablet SMARTSIG:3-5 Tablet(s) By Mouth Daily PRN     Allergies:   Ibuprofen and Shellfish allergy    Social History   Socioeconomic History   Marital status: Single    Spouse name: Not on file   Number of children: Not on file   Years of education: Not on file   Highest education level: Not on file  Occupational History   Occupation: truck driver  Tobacco Use   Smoking status: Former    Packs/day: 0.50    Years: 10.00    Pack years: 5.00    Types: Cigarettes    Quit date: 02/07/2013    Years since quitting: 7.8   Smokeless tobacco: Never  Substance and Sexual Activity   Alcohol use: Yes    Alcohol/week: 0.0 standard drinks   Drug use: No   Sexual activity: Not on file  Other Topics Concern   Not on file  Social History Narrative   Not on file   Social Determinants of Health   Financial Resource Strain: Not on file  Food Insecurity: Not on file  Transportation Needs: Not on file  Physical Activity: Not on file  Stress: Not on file  Social Connections: Not on file     Family History: The patient's family history includes Allergies in his son; Asthma in his son; Thyroid disease in his maternal grandmother, mother, and sister.  ROS:   Please see the history of present illness.     All other systems reviewed and are negative.  EKGs/Labs/Other Studies Reviewed:    The  following studies were reviewed today:   EKG:  EKG is  ordered today.  The ekg ordered today demonstrates   EKG 11/21/2020- atrial fibrillation 88 bpm Qtc 412 ms  EKG 12/08/2020- atrial fibrillation , QTc 395 ms EKG 12/19/2020 - atrial fibrillation 90 bpm, Qtc 414ms   Recent Labs: No results found for requested labs within last 8760 hours.  Recent Lipid Panel No results found for: CHOL, TRIG, HDL, CHOLHDL, VLDL, LDLCALC, LDLDIRECT   Risk Assessment/Calculations:    CHA2DS2-VASc Score = 0   This indicates a 0.2% annual risk of stroke. The patient's score is based upon: CHF History: 0 HTN History: 0 Diabetes History: 0 Stroke History: 0 Vascular Disease History: 0 Age Score: 0 Gender  Score: 0          Physical Exam:    VS:  BP 138/76   Pulse 90   Ht 6' (1.829 m)   Wt 267 lb 9.6 oz (121.4 kg)   SpO2 98%   BMI 36.29 kg/m     Wt Readings from Last 3 Encounters:  12/19/20 267 lb 9.6 oz (121.4 kg)  05/05/15 220 lb (99.8 kg)  02/27/15 241 lb (109.3 kg)     GEN:  Well nourished, well developed in no acute distress HEENT: Normal NECK: No JVD; No carotid bruits LYMPHATICS: No lymphadenopathy CARDIAC: RRR, no murmurs, rubs, gallops RESPIRATORY:  Clear to auscultation without rales, wheezing or rhonchi  ABDOMEN: Soft, non-tender, non-distended MUSCULOSKELETAL:  No edema; No deformity  SKIN: Warm and dry NEUROLOGIC:  Alert and oriented x 3 PSYCHIATRIC:  Normal affect   ASSESSMENT:    #Paroxysmal Atrial Fibrillation: New onset diagnosed 11/21/2020. Started eliquis around 12/08/2020. Will plan for DCCV without TEE scheduled January 11th 2023. Will continue metop XL 25 mg daily. If he does not maintain sinus rhythm can plan for consideration of flecainide/repeat DCCV. CHADS2VASC 0:  after the cardioversion, plan for 4 weeks of eliquis afterwards and then can stop  Thyroid: Hyperthyroid. TSH 0.07. Plan for radioactive iodine Stop Bang-4-5: recommend PCP work up with a sleep study Heart Dx: pending echo. No hx of ischemic heart dx.  PLAN:    In order of problems listed above:  TTE Cardioversion January 17, 2021 Follow up in 3 months  Shared Decision Making/Informed Consent The risks (stroke, cardiac arrhythmias rarely resulting in the need for a temporary or permanent pacemaker, skin irritation or burns and complications associated with conscious sedation including aspiration, arrhythmia, respiratory failure and death), benefits (restoration of normal sinus rhythm) and alternatives of a direct current cardioversion were explained in detail to [name] and he agrees to proceed.     Medication Adjustments/Labs and Tests Ordered: Current medicines are reviewed  at length with the patient today.  Concerns regarding medicines are outlined above.  Orders Placed This Encounter  Procedures   Basic metabolic panel   CBC   EKG 12-Lead   ECHOCARDIOGRAM COMPLETE   No orders of the defined types were placed in this encounter.   Patient Instructions  Medication Instructions:  No Changes In Medications at this time.  *If you need a refill on your cardiac medications before your next appointment, please call your pharmacy*  Lab Work: BMET AND CBC TODAY  If you have labs (blood work) drawn today and your tests are completely normal, you will receive your results only by: [place] (if you have MyChart) OR A paper copy in the mail If you have any lab test that is abnormal or we  need to change your treatment, we will call you to review the results.  Testing/Procedures: Your physician has requested that you have an echocardiogram. Echocardiography is a painless test that uses sound waves to create images of your heart. It provides your doctor with information about the size and shape of your heart and how well your heart's chambers and valves are working. You may receive an ultrasound enhancing agent through an IV if needed to better visualize your heart during the echo.This procedure takes approximately one hour. There are no restrictions for this procedure. This will take place at the [address]   Dear Mr. Owenn Rothermel are scheduled for a Cardioversion on January 11th  with Dr. Flora Lipps .  Please arrive at the [facility] (Main Entrance A) at [facility]: [address] [place], [place] [address] at 8 am (1 hour prior to procedure unless lab work is needed; if lab work is needed arrive 1.5 hours ahead)  DIET: Nothing to eat or drink after midnight except a sip of water with medications (see medication instructions below)  FYI: For your safety, and to allow [place] to monitor your vital signs accurately during the surgery/procedure we  request that   if you have artificial nails, gel coating, SNS etc. Please have those removed prior to your surgery/procedure. Not having the nail coverings /polish removed may result in cancellation or delay of your surgery/procedure.  Continue your anticoagulant: Eliquis You will need to continue your anticoagulant after your procedure until you  are told by your  Provider that it is safe to stop  You must have a responsible person to drive you home and stay in the waiting area during your procedure. Failure to do so could result in cancellation.  Bring your insurance cards.  *Special Note: Every effort is made to have your procedure done on time. Occasionally there are emergencies that occur at the hospital that may cause delays. Please be patient if a delay does occur.    Follow-Up: At [facility], you and your health needs are our priority.  As part of our continuing mission to provide you with exceptional heart care, we have created designated Provider Care Teams.  These Care Teams include your primary Cardiologist (physician) and Advanced Practice Providers (APPs -  Physician Assistants and Nurse Practitioners) who all work together to provide you with the care you need, when you need it.  We recommend signing up for the patient portal called "MyChart".  Sign up information is provided on this After Visit Summary.  MyChart is used to connect with patients for Virtual Visits (Telemedicine).  Patients are able to view lab/test results, encounter notes, upcoming appointments, etc.  Non-urgent messages can be sent to your provider as well.   To learn more about what you can do with MyChart, go to [email or web address].    Your next appointment:   3 month(s)  The format for your next appointment:   In Person  Provider:    Dr. Wyline Mood   Signed, Maisie Fus, MD  12/19/2020 8:58 AM    [facility]

## 2020-12-19 NOTE — Patient Instructions (Addendum)
Medication Instructions:  No Changes In Medications at this time.  *If you need a refill on your cardiac medications before your next appointment, please call your pharmacy*  Lab Work: BMET AND CBC TODAY  If you have labs (blood work) drawn today and your tests are completely normal, you will receive your results only by: MyChart Message (if you have MyChart) OR A paper copy in the mail If you have any lab test that is abnormal or we need to change your treatment, we will call you to review the results.  Testing/Procedures: Your physician has requested that you have an echocardiogram. Echocardiography is a painless test that uses sound waves to create images of your heart. It provides your doctor with information about the size and shape of your heart and how well your heart's chambers and valves are working. You may receive an ultrasound enhancing agent through an IV if needed to better visualize your heart during the echo.This procedure takes approximately one hour. There are no restrictions for this procedure. This will take place at the [address].   Dear [name] are scheduled for a Cardioversion on January 11th  with Dr. Flora Lipps .  Please arrive at the [facility] (Main Entrance A) at [facility]: [address] [place], [place] [address] at 8 am (1 hour prior to procedure unless lab work is needed; if lab work is needed arrive 1.5 hours ahead)  DIET: Nothing to eat or drink after midnight except a sip of water with medications (see medication instructions below)  FYI: For your safety, and to allow [place] to monitor your vital signs accurately during the surgery/procedure we request that   if you have artificial nails, gel coating, SNS etc. Please have those removed prior to your surgery/procedure. Not having the nail coverings /polish removed may result in cancellation or delay of your surgery/procedure.  Continue your anticoagulant: Eliquis You will need to  continue your anticoagulant after your procedure until you  are told by your  Provider that it is safe to stop  You must have a responsible person to drive you home and stay in the waiting area during your procedure. Failure to do so could result in cancellation.  Bring your insurance cards.  *Special Note: Every effort is made to have your procedure done on time. Occasionally there are emergencies that occur at the hospital that may cause delays. Please be patient if a delay does occur.    Follow-Up: At [facility], you and your health needs are our priority.  As part of our continuing mission to provide you with exceptional heart care, we have created designated Provider Care Teams.  These Care Teams include your primary Cardiologist (physician) and Advanced Practice Providers (APPs -  Physician Assistants and Nurse Practitioners) who all work together to provide you with the care you need, when you need it.  We recommend signing up for the patient portal called "MyChart".  Sign up information is provided on this After Visit Summary.  MyChart is used to connect with patients for Virtual Visits (Telemedicine).  Patients are able to view lab/test results, encounter notes, upcoming appointments, etc.  Non-urgent messages can be sent to your provider as well.   To learn more about what you can do with MyChart, go to [email or web address].    Your next appointment:   3 month(s)  The format for your next appointment:   In Person  Provider:    Dr. Wyline Mood

## 2020-12-19 NOTE — H&P (View-Only) (Signed)
Cardiology Office Note:    Date:  12/19/2020   ID:  Colin [name], DOB [birth year], MRN [id number]  PCP:  Marda Stalker, PA-C   [facility] Providers Cardiologist:  Janina Mayo, MD     Referring MD: Kristen Loader, FNP   No chief complaint on file.  Atrial fibrillation  History of Present Illness:    Colin [name] is a 55 y.o. male with a hx of GERD, hyperthyroidism, referral from [facility] for new onset atrial fibrillation  Patient was told that he had diagnosis of atrial fibrillation from [facility] screening. This is a new diagnosis. He had an EKG 11/21/2020.  He feels fast hear rates sometimes. He feels heart racing. No hypertension. No diabetes.He was told he is borderline. No hx of CHF. No stroke. No hx of stress test. He reports he does snore. He at times gets some midsternal pain that feels sore. It feels sore. At rest mainly. No DOE. No orthopnea or PND. No LE edema.  Past Medical Hx: Hyperthyroidism seeing endocrinology and recommended to radioactive iodine pill. His TSH is 0.07. He's had significant fluctuations since 2017.  He takes methimazole 10 mg. Metoprolol XL 25 mg. He is on eliquis 5 mg started two weeks ; 12/2.  Family Hx: Father had cancer. Had heart shocking and his out of rhythm. Possible atrial fibrillation. MI. Thyroid dx in the family. His younger brother has heart problems and morbid obesity  Social Hx:  Smoker since 3; smoke occasionally. Long distance truck driver.  [facility] labs A1c - 5.8 % Crt 1.0   08/21/2020 LDL113   Current Medications: Current Meds  Medication Sig   apixaban (ELIQUIS) 5 MG TABS tablet 1 tablet   methimazole (TAPAZOLE) 10 MG tablet TAKE 2 TABLETS (20 MG TOTAL) BY MOUTH TWICE DAILY   metoprolol succinate (TOPROL-XL) 25 MG 24 hr tablet Take 1 tablet (25 mg total) by mouth daily.   sildenafil (REVATIO) 20 MG tablet SMARTSIG:3-5 Tablet(s) By Mouth Daily PRN     Allergies:   Ibuprofen and Shellfish allergy    Social History   Socioeconomic History   Marital status: Single    Spouse name: Not on file   Number of children: Not on file   Years of education: Not on file   Highest education level: Not on file  Occupational History   Occupation: truck driver  Tobacco Use   Smoking status: Former    Packs/day: 0.50    Years: 10.00    Pack years: 5.00    Types: Cigarettes    Quit date: 02/07/2013    Years since quitting: 7.8   Smokeless tobacco: Never  Substance and Sexual Activity   Alcohol use: Yes    Alcohol/week: 0.0 standard drinks   Drug use: No   Sexual activity: Not on file  Other Topics Concern   Not on file  Social History Narrative   Not on file   Social Determinants of Health   Financial Resource Strain: Not on file  Food Insecurity: Not on file  Transportation Needs: Not on file  Physical Activity: Not on file  Stress: Not on file  Social Connections: Not on file     Family History: The patient's family history includes Allergies in his son; Asthma in his son; Thyroid disease in his maternal grandmother, mother, and sister.  ROS:   Please see the history of present illness.     All other systems reviewed and are negative.  EKGs/Labs/Other Studies Reviewed:    The  following studies were reviewed today:   EKG:  EKG is  ordered today.  The ekg ordered today demonstrates   EKG 11/21/2020- atrial fibrillation 88 bpm Qtc 412 ms  EKG 12/08/2020- atrial fibrillation , QTc 395 ms EKG 12/19/2020 - atrial fibrillation 90 bpm, Qtc 457ms   Recent Labs: No results found for requested labs within last 8760 hours.  Recent Lipid Panel No results found for: CHOL, TRIG, HDL, CHOLHDL, VLDL, LDLCALC, LDLDIRECT   Risk Assessment/Calculations:    CHA2DS2-VASc Score = 0   This indicates a 0.2% annual risk of stroke. The patient's score is based upon: CHF History: 0 HTN History: 0 Diabetes History: 0 Stroke History: 0 Vascular Disease History: 0 Age Score: 0 Gender  Score: 0          Physical Exam:    VS:  BP 138/76    Pulse 90    Ht 6' (1.829 m)    Wt 267 lb 9.6 oz (121.4 kg)    SpO2 98%    BMI 36.29 kg/m     Wt Readings from Last 3 Encounters:  12/19/20 267 lb 9.6 oz (121.4 kg)  05/05/15 220 lb (99.8 kg)  02/27/15 241 lb (109.3 kg)     GEN:  Well nourished, well developed in no acute distress HEENT: Normal NECK: No JVD; No carotid bruits LYMPHATICS: No lymphadenopathy CARDIAC: RRR, no murmurs, rubs, gallops RESPIRATORY:  Clear to auscultation without rales, wheezing or rhonchi  ABDOMEN: Soft, non-tender, non-distended MUSCULOSKELETAL:  No edema; No deformity  SKIN: Warm and dry NEUROLOGIC:  Alert and oriented x 3 PSYCHIATRIC:  Normal affect   ASSESSMENT:    #Paroxysmal Atrial Fibrillation: New onset diagnosed 11/21/2020. Started eliquis around 12/08/2020. Will plan for DCCV without TEE scheduled January 11th 2023. Will continue metop XL 25 mg daily. If he does not maintain sinus rhythm can plan for consideration of flecainide/repeat DCCV. CHADS2VASC 0:  after the cardioversion, plan for 4 weeks of eliquis afterwards and then can stop  Thyroid: Hyperthyroid. TSH 0.07. Plan for radioactive iodine Stop Bang-4-5: recommend PCP work up with a sleep study Heart Dx: pending echo. No hx of ischemic heart dx.  PLAN:    In order of problems listed above:  TTE Cardioversion January 17, 2021 Follow up in 3 months  Shared Decision Making/Informed Consent The risks (stroke, cardiac arrhythmias rarely resulting in the need for a temporary or permanent pacemaker, skin irritation or burns and complications associated with conscious sedation including aspiration, arrhythmia, respiratory failure and death), benefits (restoration of normal sinus rhythm) and alternatives of a direct current cardioversion were explained in detail to Colin [name] and he agrees to proceed.     Medication Adjustments/Labs and Tests Ordered: Current medicines are reviewed  at length with the patient today.  Concerns regarding medicines are outlined above.  Orders Placed This Encounter  Procedures   Basic metabolic panel   CBC   EKG 12-Lead   ECHOCARDIOGRAM COMPLETE   No orders of the defined types were placed in this encounter.   Patient Instructions  Medication Instructions:  No Changes In Medications at this time.  *If you need a refill on your cardiac medications before your next appointment, please call your pharmacy*  Lab Work: BMET AND CBC TODAY  If you have labs (blood work) drawn today and your tests are completely normal, you will receive your results only by: [place] (if you have MyChart) OR A paper copy in the mail If you have any lab test  that is abnormal or we need to change your treatment, we will call you to review the results.  Testing/Procedures: Your physician has requested that you have an echocardiogram. Echocardiography is a painless test that uses sound waves to create images of your heart. It provides your doctor with information about the size and shape of your heart and how well your hearts chambers and valves are working. You may receive an ultrasound enhancing agent through an IV if needed to better visualize your heart during the echo.This procedure takes approximately one hour. There are no restrictions for this procedure. This will take place at the [address].   Dear Colin [name] are scheduled for a Cardioversion on January 11th  with Dr. Audie Box .  Please arrive at the [facility] (Main Entrance A) at [facility]: [place], [place] [address] at 8 am (1 hour prior to procedure unless lab work is needed; if lab work is needed arrive 1.5 hours ahead)  DIET: Nothing to eat or drink after midnight except a sip of water with medications (see medication instructions below)  FYI: For your safety, and to allow [place] to monitor your vital signs accurately during the surgery/procedure we  request that   if you have artificial nails, gel coating, SNS etc. Please have those removed prior to your surgery/procedure. Not having the nail coverings /polish removed may result in cancellation or delay of your surgery/procedure.  Continue your anticoagulant: Eliquis You will need to continue your anticoagulant after your procedure until you  are told by your  Provider that it is safe to stop  You must have a responsible person to drive you home and stay in the waiting area during your procedure. Failure to do so could result in cancellation.  Bring your insurance cards.  *Special Note: Every effort is made to have your procedure done on time. Occasionally there are emergencies that occur at the hospital that may cause delays. Please be patient if a delay does occur.    Follow-Up: At [facility], you and your health needs are our priority.  As part of our continuing mission to provide you with exceptional heart care, we have created designated Provider Care Teams.  These Care Teams include your primary Cardiologist (physician) and Advanced Practice Providers (APPs -  Physician Assistants and Nurse Practitioners) who all work together to provide you with the care you need, when you need it.  We recommend signing up for the patient portal called "MyChart".  Sign up information is provided on this After Visit Summary.  MyChart is used to connect with patients for Virtual Visits (Telemedicine).  Patients are able to view lab/test results, encounter notes, upcoming appointments, etc.  Non-urgent messages can be sent to your provider as well.   To learn more about what you can do with MyChart, go to [email or web address].    Your next appointment:   3 month(s)  The format for your next appointment:   In Person  Provider:    Dr. Harl Bowie   Signed, Janina Mayo, MD  12/19/2020 8:58 AM    [place]

## 2020-12-20 LAB — BASIC METABOLIC PANEL
BUN/Creatinine Ratio: 15 (ref 9–20)
BUN: 15 mg/dL (ref 6–24)
CO2: 20 mmol/L (ref 20–29)
Calcium: 9.3 mg/dL (ref 8.7–10.2)
Chloride: 104 mmol/L (ref 96–106)
Creatinine, Ser: 0.97 mg/dL (ref 0.76–1.27)
Glucose: 82 mg/dL (ref 70–99)
Potassium: 4.6 mmol/L (ref 3.5–5.2)
Sodium: 137 mmol/L (ref 134–144)
eGFR: 92 mL/min/{1.73_m2} (ref >59)

## 2020-12-20 LAB — CBC
Hematocrit: 42.9 % (ref 37.5–51.0)
Hemoglobin: 14.4 g/dL (ref 13.0–17.7)
MCH: 30.5 pg (ref 26.6–33.0)
MCHC: 33.6 g/dL (ref 31.5–35.7)
MCV: 91 fL (ref 79–97)
Platelets: 386 10*3/uL (ref 150–450)
RBC: 4.72 x10E6/uL (ref 4.14–5.80)
RDW: 13.2 % (ref 11.6–15.4)
WBC: 7.3 10*3/uL (ref 3.4–10.8)

## 2021-01-03 ENCOUNTER — Ambulatory Visit (HOSPITAL_COMMUNITY): Payer: Self-pay

## 2021-01-05 ENCOUNTER — Encounter (HOSPITAL_COMMUNITY): Payer: Self-pay | Admitting: Cardiovascular Disease

## 2021-01-16 ENCOUNTER — Other Ambulatory Visit: Payer: Self-pay

## 2021-01-16 ENCOUNTER — Other Ambulatory Visit: Payer: Self-pay | Admitting: Internal Medicine

## 2021-01-16 ENCOUNTER — Ambulatory Visit (HOSPITAL_COMMUNITY): Payer: Self-pay | Attending: Cardiology

## 2021-01-16 DIAGNOSIS — I4891 Unspecified atrial fibrillation: Secondary | ICD-10-CM | POA: Insufficient documentation

## 2021-01-16 LAB — ECHOCARDIOGRAM COMPLETE
Area-P 1/2: 2.76 cm2
S' Lateral: 2.9 cm

## 2021-01-17 ENCOUNTER — Ambulatory Visit (HOSPITAL_COMMUNITY): Payer: Self-pay | Admitting: Anesthesiology

## 2021-01-17 ENCOUNTER — Other Ambulatory Visit: Payer: Self-pay

## 2021-01-17 ENCOUNTER — Encounter (HOSPITAL_COMMUNITY)
Admission: RE | Disposition: A | Discharge status: Home / Self Care | Payer: Self-pay | Source: Ambulatory Visit | Attending: Cardiovascular Disease

## 2021-01-17 ENCOUNTER — Encounter (HOSPITAL_COMMUNITY): Payer: Self-pay | Admitting: Cardiovascular Disease

## 2021-01-17 ENCOUNTER — Ambulatory Visit (HOSPITAL_COMMUNITY)
Admission: RE | Admit: 2021-01-17 | Discharge: 2021-01-17 | Disposition: A | Discharge status: Home / Self Care | Payer: Self-pay | Source: Ambulatory Visit | Attending: Cardiovascular Disease | Admitting: Cardiovascular Disease

## 2021-01-17 DIAGNOSIS — I480 Paroxysmal atrial fibrillation: Secondary | ICD-10-CM | POA: Insufficient documentation

## 2021-01-17 DIAGNOSIS — K219 Gastro-esophageal reflux disease without esophagitis: Secondary | ICD-10-CM | POA: Insufficient documentation

## 2021-01-17 DIAGNOSIS — I4891 Unspecified atrial fibrillation: Secondary | ICD-10-CM

## 2021-01-17 DIAGNOSIS — Z7901 Long term (current) use of anticoagulants: Secondary | ICD-10-CM | POA: Insufficient documentation

## 2021-01-17 DIAGNOSIS — E0590 Thyrotoxicosis, unspecified without thyrotoxic crisis or storm: Secondary | ICD-10-CM | POA: Insufficient documentation

## 2021-01-17 DIAGNOSIS — Z79899 Other long term (current) drug therapy: Secondary | ICD-10-CM | POA: Insufficient documentation

## 2021-01-17 HISTORY — PX: CARDIOVERSION: SHX1299

## 2021-01-17 LAB — POCT I-STAT, CHEM 8
BUN: 15 mg/dL (ref 6–20)
Calcium, Ion: 1.15 mmol/L (ref 1.15–1.40)
Chloride: 105 mmol/L (ref 98–111)
Creatinine, Ser: 0.9 mg/dL (ref 0.61–1.24)
Glucose, Bld: 102 mg/dL — ABNORMAL HIGH (ref 70–99)
HCT: 44 % (ref 39.0–52.0)
Hemoglobin: 15 g/dL (ref 13.0–17.0)
Potassium: 4.2 mmol/L (ref 3.5–5.1)
Sodium: 139 mmol/L (ref 135–145)
TCO2: 23 mmol/L (ref 22–32)

## 2021-01-17 SURGERY — CARDIOVERSION
Anesthesia: General

## 2021-01-17 MED ORDER — PROPOFOL 10 MG/ML IV BOLUS
INTRAVENOUS | Status: DC | PRN
  Administered 2021-01-17: 25 mg via INTRAVENOUS
  Administered 2021-01-17: 80 mg via INTRAVENOUS
  Administered 2021-01-17: 25 mg via INTRAVENOUS
  Administered 2021-01-17: 20 mg via INTRAVENOUS

## 2021-01-17 MED ORDER — LIDOCAINE 2% (20 MG/ML) 5 ML SYRINGE
INTRAMUSCULAR | Status: DC | PRN
  Administered 2021-01-17: 20 mg via INTRAVENOUS

## 2021-01-17 MED ORDER — SODIUM CHLORIDE 0.9 % IV SOLN
INTRAVENOUS | Status: AC | PRN
  Administered 2021-01-17: 500 mL via INTRAVENOUS

## 2021-01-17 NOTE — Interval H&P Note (Signed)
History and Physical Interval Note:  01/17/2021 8:29 AM  Colin [name]  has presented today for surgery, with the diagnosis of AFIB.  The various methods of treatment have been discussed with the patient and family. After consideration of risks, benefits and other options for treatment, the patient has consented to  Procedure(s): CARDIOVERSION (N/A) as a surgical intervention.  The patient's history has been reviewed, patient examined, no change in status, stable for surgery.  I have reviewed the patient's chart and labs.  Questions were answered to the patient's satisfaction.    NPO for DCCV. On eliquis >3 weeks. No missed doses.   [name], MD, [facility]  [address] [place], [place] [address] [phone]  8:30 AM

## 2021-01-17 NOTE — CV Procedure (Signed)
° °  DIRECT CURRENT CARDIOVERSION  NAME:  Colin [name]    MRN: [id number] DOB:  08-Nov-1965    ADMIT DATE: 01/17/2021  Indication:  Symptomatic atrial fibrillation   Procedure Note:  The patient signed informed consent.  They have had had therapeutic anticoagulation with eliquis greater than 3 weeks.  Anesthesia was administered by Dr. Sampson Goon.  Adequate airway was maintained throughout and vital followed per protocol.  They were cardioverted x 3 with 200J of biphasic synchronized energy.  They converted to NSR.  There were no apparent complications.  The patient had normal neuro status and respiratory status post procedure with vitals stable as recorded elsewhere.    Follow up: They will continue on current medical therapy and follow up with cardiology as scheduled.  [name], MD, [facility]   [facility]  [address], [address] [place], [place] [address] [phone]  8:57 AM

## 2021-01-17 NOTE — Anesthesia Preprocedure Evaluation (Signed)
Anesthesia Evaluation  Patient identified by MRN, date of birth, ID band Patient awake    Reviewed: Allergy & Precautions, NPO status , Patient's Chart, lab work & pertinent test results  Airway Mallampati: II  TM Distance: >3 FB Neck ROM: Full    Dental  (+) Dental Advisory Given   Pulmonary former smoker,    breath sounds clear to auscultation       Cardiovascular + dysrhythmias Atrial Fibrillation  Rhythm:Regular Rate:Normal     Neuro/Psych negative neurological ROS     GI/Hepatic negative GI ROS, Neg liver ROS,   Endo/Other  Hyperthyroidism   Renal/GU negative Renal ROS     Musculoskeletal   Abdominal   Peds  Hematology negative hematology ROS (+)   Anesthesia Other Findings   Reproductive/Obstetrics                             Anesthesia Physical Anesthesia Plan  ASA: 2  Anesthesia Plan: General   Post-op Pain Management: Minimal or no pain anticipated   Induction: Intravenous  PONV Risk Score and Plan: 2 and Treatment may vary due to age or medical condition  Airway Management Planned: Natural Airway and Mask  Additional Equipment:   Intra-op Plan:   Post-operative Plan:   Informed Consent: I have reviewed the patients History and Physical, chart, labs and discussed the procedure including the risks, benefits and alternatives for the proposed anesthesia with the patient or authorized representative who has indicated his/her understanding and acceptance.       Plan Discussed with: CRNA  Anesthesia Plan Comments:         Anesthesia Quick Evaluation

## 2021-01-17 NOTE — Transfer of Care (Signed)
Immediate Anesthesia Transfer of Care Note  Patient: Colin [name]  Procedure(s) Performed: CARDIOVERSION  Patient Location: PACU and Endoscopy Unit  Anesthesia Type:General  Level of Consciousness: drowsy and patient cooperative  Airway & Oxygen Therapy: Patient Spontanous Breathing and Patient connected to nasal cannula oxygen  Post-op Assessment: Report given to RN and Post -op Vital signs reviewed and stable  Post vital signs: Reviewed and stable  Last Vitals:  Vitals Value Taken Time  BP 135/87   Temp    Pulse 62   Resp 18   SpO2 100     Last Pain:  Vitals:   01/17/21 0818  TempSrc: Oral  PainSc: 0-No pain         Complications: No notable events documented.

## 2021-01-17 NOTE — Discharge Instructions (Signed)
Electrical Cardioversion  Electrical cardioversion is the delivery of a jolt of electricity to restore a normal rhythm to the heart. A rhythm that is too fast or is not regular keeps the heart from pumping well. In this procedure, sticky patches or metal paddles are placed on the chest to deliver electricity to the heart from a device.  What can I expect after the procedure?  Your blood pressure, heart rate, breathing rate, and blood oxygen level will be monitored until you leave the hospital or clinic.  Your heart rhythm will be watched to make sure it does not change.  You may have some redness on the skin where the shocks were given.If this occurs, can use hydrocortisone cream or Aloe vera.  Follow these instructions at home:  Do not drive for 24 hours if you were given a sedative during your procedure.  Take over-the-counter and prescription medicines only as told by your health care provider.  Ask your health care provider how to check your pulse. Check it often.  Rest for 48 hours after the procedure or as told by your health care provider.  Avoid or limit your caffeine use as told by your health care provider.  Keep all follow-up visits as told by your health care provider. This is important.  Contact a health care provider if:  You feel like your heart is beating too quickly or your pulse is not regular.  You have a serious muscle cramp that does not go away.  Get help right away if:  You have discomfort in your chest.  You are dizzy or you feel faint.  You have trouble breathing or you are short of breath.  Your speech is slurred.  You have trouble moving an arm or leg on one side of your body.  Your fingers or toes turn cold or blue.  Summary  Electrical cardioversion is the delivery of a jolt of electricity to restore a normal rhythm to the heart.  This procedure may be done right away in an emergency or may be a scheduled procedure if the condition is not  an emergency.  Generally, this is a safe procedure.  After the procedure, check your pulse often as told by your health care provider.  This information is not intended to replace advice given to you by your health care provider. Make sure you discuss any questions you have with your health care provider. Document Revised: 07/27/2018 Document Reviewed: 07/27/2018 Elsevier Patient Education  2021 [employer] Inc.  

## 2021-01-17 NOTE — Anesthesia Procedure Notes (Signed)
Procedure Name: General with mask airway Date/Time: 01/17/2021 8:49 AM Performed by: Demetrio Lapping, CRNA Pre-anesthesia Checklist: Patient identified, Emergency Drugs available, Suction available, Patient being monitored and Timeout performed Patient Re-evaluated:Patient Re-evaluated prior to induction Oxygen Delivery Method: Ambu bag Preoxygenation: Pre-oxygenation with 100% oxygen Induction Type: IV induction Ventilation: Mask ventilation without difficulty Placement Confirmation: positive ETCO2 Dental Injury: Teeth and Oropharynx as per pre-operative assessment

## 2021-01-17 NOTE — Anesthesia Postprocedure Evaluation (Signed)
Anesthesia Post Note  Patient: Colin [name]  Procedure(s) Performed: CARDIOVERSION     Patient location during evaluation: PACU Anesthesia Type: General Level of consciousness: awake and alert Pain management: pain level controlled Vital Signs Assessment: post-procedure vital signs reviewed and stable Respiratory status: spontaneous breathing, nonlabored ventilation, respiratory function stable and patient connected to nasal cannula oxygen Cardiovascular status: blood pressure returned to baseline and stable Postop Assessment: no apparent nausea or vomiting Anesthetic complications: no   No notable events documented.  Last Vitals:  Vitals:   01/17/21 0910 01/17/21 0918  BP: 111/82 107/76  Pulse: 60 (!) 59  Resp: 17 17  Temp:    SpO2: 100% 98%    Last Pain:  Vitals:   01/17/21 0918  TempSrc:   PainSc: 0-No pain                 Kennieth Rad

## 2021-01-19 ENCOUNTER — Encounter (HOSPITAL_COMMUNITY): Payer: Self-pay | Admitting: Cardiovascular Disease

## 2021-02-05 ENCOUNTER — Other Ambulatory Visit: Payer: Self-pay | Admitting: Internal Medicine

## 2021-02-05 DIAGNOSIS — E010 Iodine-deficiency related diffuse (endemic) goiter: Secondary | ICD-10-CM

## 2021-02-26 ENCOUNTER — Ambulatory Visit
Admission: RE | Admit: 2021-02-26 | Discharge: 2021-02-26 | Disposition: A | Discharge status: Home / Self Care | Payer: No Typology Code available for payment source | Source: Ambulatory Visit | Attending: Internal Medicine | Admitting: Internal Medicine

## 2021-02-26 DIAGNOSIS — E010 Iodine-deficiency related diffuse (endemic) goiter: Secondary | ICD-10-CM

## 2021-04-05 ENCOUNTER — Ambulatory Visit: Payer: Self-pay | Admitting: Internal Medicine

## 2021-04-10 ENCOUNTER — Encounter: Payer: Self-pay | Admitting: Internal Medicine

## 2021-05-25 ENCOUNTER — Encounter: Payer: Self-pay | Admitting: Internal Medicine

## 2021-05-25 ENCOUNTER — Ambulatory Visit: Payer: Self-pay | Admitting: Internal Medicine

## 2021-05-25 VITALS — BP 124/79 | HR 78 | Ht 72.0 in | Wt 275.8 lb

## 2021-05-25 DIAGNOSIS — I4891 Unspecified atrial fibrillation: Secondary | ICD-10-CM

## 2021-05-25 NOTE — Progress Notes (Signed)
Cardiology Office Note:    Date:  05/25/2021   ID:  Colin [name], DOB [birth year], MRN [id number]  PCP:  Marda Stalker, PA-C   [place] Providers Cardiologist:  Janina Mayo, MD     Referring MD: Marda Stalker, PA-C   No chief complaint on file.   Atrial fibrillation  History of Present Illness:    Colin [name] is a 56 y.o. male with a hx of GERD, hyperthyroidism, referral from [facility] for new onset atrial fibrillation  Patient was told that he had diagnosis of atrial fibrillation from [facility] screening. This is a new diagnosis. He had an EKG 11/21/2020.  He feels fast hear rates sometimes. He feels heart racing. No hypertension. No diabetes.He was told he is borderline. No hx of CHF. No stroke. No hx of stress test. He reports he does snore. He at times gets some midsternal pain that feels sore. It feels sore. At rest mainly. No DOE. No orthopnea or PND. No LE edema.  Past Medical Hx: Hyperthyroidism seeing endocrinology and recommended to radioactive iodine pill. His TSH is 0.07. He's had significant fluctuations since 2017.  He takes methimazole 10 mg. Metoprolol XL 25 mg. He is on eliquis 5 mg started two weeks ; 12/2.  Family Hx: Father had cancer. Had heart shocking and his out of rhythm. Possible atrial fibrillation. MI. Thyroid dx in the family. His younger brother has heart problems and morbid obesity  Social Hx:  Smoker since 55; smoke occasionally. Long distance truck driver.  [facility] labs A1c - 5.8 % Crt 1.0   08/21/2020 LDL113  Interim Hx 05/24/2021 He underwent DCCV in January.  Converted to NSR. Notes occasional chest pains for a few seconds. NO chest pressure with exertion or SOB. He drives a truck to [place] and gets minimal exercise, but he plans to get weights in his truck so he can exercise at stops along the way. Did eliquis for 1 month. No LH or dizziness.    TTE 01/16/2021- Normal LV function, LA size 43 cc/m2, no valve  disease   Current Medications: Current Meds  Medication Sig   methimazole (TAPAZOLE) 10 MG tablet TAKE 2 TABLETS (20 MG TOTAL) BY MOUTH TWICE DAILY (Patient taking differently: Take 10 mg by mouth See admin instructions. Taking 10 mg five days a week ( skipping Sat & Sun))   metoprolol succinate (TOPROL-XL) 50 MG 24 hr tablet Take 50 mg by mouth daily.     Allergies:   Ibuprofen and Shellfish allergy   Social History   Socioeconomic History   Marital status: Single    Spouse name: Not on file   Number of children: Not on file   Years of education: Not on file   Highest education level: Not on file  Occupational History   Occupation: truck driver  Tobacco Use   Smoking status: Former    Packs/day: 0.50    Years: 10.00    Pack years: 5.00    Types: Cigarettes    Quit date: 02/07/2013    Years since quitting: 8.2   Smokeless tobacco: Never  Substance and Sexual Activity   Alcohol use: Yes    Alcohol/week: 0.0 standard drinks   Drug use: No   Sexual activity: Not on file  Other Topics Concern   Not on file  Social History Narrative   Not on file   Social Determinants of Health   Financial Resource Strain: Not on file  Food Insecurity: Not on file  Transportation Needs: Not  on file  Physical Activity: Not on file  Stress: Not on file  Social Connections: Not on file     Family History: The patient's family history includes Allergies in his son; Asthma in his son; Thyroid disease in his maternal grandmother, mother, and sister.  ROS:   Please see the history of present illness.     All other systems reviewed and are negative.  EKGs/Labs/Other Studies Reviewed:    The following studies were reviewed today:   EKG:  EKG is  ordered today.  The ekg ordered today demonstrates   EKG 11/21/2020- atrial fibrillation 88 bpm Qtc 412 ms  EKG 12/08/2020- atrial fibrillation , QTc 395 ms EKG 12/19/2020 - atrial fibrillation 90 bpm, Qtc 429ms   Recent Labs: 12/19/2020:  Platelets 386 01/17/2021: BUN 15; Creatinine, Ser 0.90; Hemoglobin 15.0; Potassium 4.2; Sodium 139  Recent Lipid Panel No results found for: CHOL, TRIG, HDL, CHOLHDL, VLDL, LDLCALC, LDLDIRECT   Risk Assessment/Calculations:    CHA2DS2-VASc Score = 0   This indicates a 0.2% annual risk of stroke. The patient's score is based upon: CHF History: 0 HTN History: 0 Diabetes History: 0 Stroke History: 0 Vascular Disease History: 0 Age Score: 0 Gender Score: 0           Physical Exam:    VS:    Vitals:   05/25/21 1056  BP: 124/79  Pulse: 78  SpO2: 97%    BP 124/79   Pulse 78   Ht 6' (1.829 m)   Wt 275 lb 12.8 oz (125.1 kg)   SpO2 97%   BMI 37.41 kg/m     Wt Readings from Last 3 Encounters:  05/25/21 275 lb 12.8 oz (125.1 kg)  01/17/21 270 lb (122.5 kg)  12/19/20 267 lb 9.6 oz (121.4 kg)     GEN:  Well nourished, well developed in no acute distress HEENT: Normal NECK: No JVD; No carotid bruits LYMPHATICS: No lymphadenopathy CARDIAC: RRR, no murmurs, rubs, gallops RESPIRATORY:  Clear to auscultation without rales, wheezing or rhonchi  ABDOMEN: Soft, non-tender, non-distended MUSCULOSKELETAL:  No edema; No deformity  SKIN: Warm and dry NEUROLOGIC:  Alert and oriented x 3 PSYCHIATRIC:  Normal affect   ASSESSMENT:    #Paroxysmal Atrial Fibrillation: New onset diagnosed 11/21/2020. Started eliquis around 12/08/2020. He is s/p DCCV in January 2023. Will continue metop XL 50 mg daily. If he does not maintain sinus rhythm can plan for consideration of flecainide/repeat DCCV. He's in sinus rhythm today. CHADS2VASC 0:  no AC Thyroid: Hyperthyroidism. Follows with endocrine, on methimazole No significant snoring or apnea, if this were to arise, recommend a sleep study  PLAN:    In order of problems listed above:  Follow up in 6 months  Shared Decision Making/Informed Consent The risks (stroke, cardiac arrhythmias rarely resulting in the need for a temporary or  permanent pacemaker, skin irritation or burns and complications associated with conscious sedation including aspiration, arrhythmia, respiratory failure and death), benefits (restoration of normal sinus rhythm) and alternatives of a direct current cardioversion were explained in detail to Mr. Westfahl and he agrees to proceed.     Medication Adjustments/Labs and Tests Ordered: Current medicines are reviewed at length with the patient today.  Concerns regarding medicines are outlined above.  No orders of the defined types were placed in this encounter.  No orders of the defined types were placed in this encounter.    Patient Instructions  Medication Instructions:  No changes     Lab Work:  Not needed   Testing/Procedures: Not needed   Follow-Up: At [facility], you and your health needs are our priority.  As part of our continuing mission to provide you with exceptional heart care, we have created designated Provider Care Teams.  These Care Teams include your primary Cardiologist (physician) and Advanced Practice Providers (APPs -  Physician Assistants and Nurse Practitioners) who all work together to provide you with the care you need, when you need it.  We recommend signing up for the patient portal called "MyChart".  Sign up information is provided on this After Visit Summary.  MyChart is used to connect with patients for Virtual Visits (Telemedicine).  Patients are able to view lab/test results, encounter notes, upcoming appointments, etc.  Non-urgent messages can be sent to your provider as well.   To learn more about what you can do with MyChart, go to [email or web address].    Your next appointment:   6 month(s)  The format for your next appointment:   In Person  Provider:   Janina Mayo, MD       Important Information About Sugar         Signed, Janina Mayo, MD  05/25/2021 11:19 AM    [place]

## 2021-05-25 NOTE — Patient Instructions (Signed)
Medication Instructions:  No changes     Lab Work: Not needed   Testing/Procedures: Not needed   Follow-Up: At [facility], you and your health needs are our priority.  As part of our continuing mission to provide you with exceptional heart care, we have created designated Provider Care Teams.  These Care Teams include your primary Cardiologist (physician) and Advanced Practice Providers (APPs -  Physician Assistants and Nurse Practitioners) who all work together to provide you with the care you need, when you need it.  We recommend signing up for the patient portal called "MyChart".  Sign up information is provided on this After Visit Summary.  MyChart is used to connect with patients for Virtual Visits (Telemedicine).  Patients are able to view lab/test results, encounter notes, upcoming appointments, etc.  Non-urgent messages can be sent to your provider as well.   To learn more about what you can do with MyChart, go to [email or web address].    Your next appointment:   6 month(s)  The format for your next appointment:   In Person  Provider:   Maisie Fus, MD       Important Information About Sugar

## 2021-06-03 IMAGING — MG DIGITAL DIAGNOSTIC BILAT W/ TOMO W/ CAD
6 of 10 series · 6 of 30 positions shown · non-contrast
Comparison: None.

CLINICAL DATA: 53-year-old male with generalized left breast
swelling as well as tenderness/soreness predominantly involving the
left nipple area.

EXAM:
DIGITAL DIAGNOSTIC BILATERAL MAMMOGRAM WITH CAD AND TOMO

[L CC synth-2D]
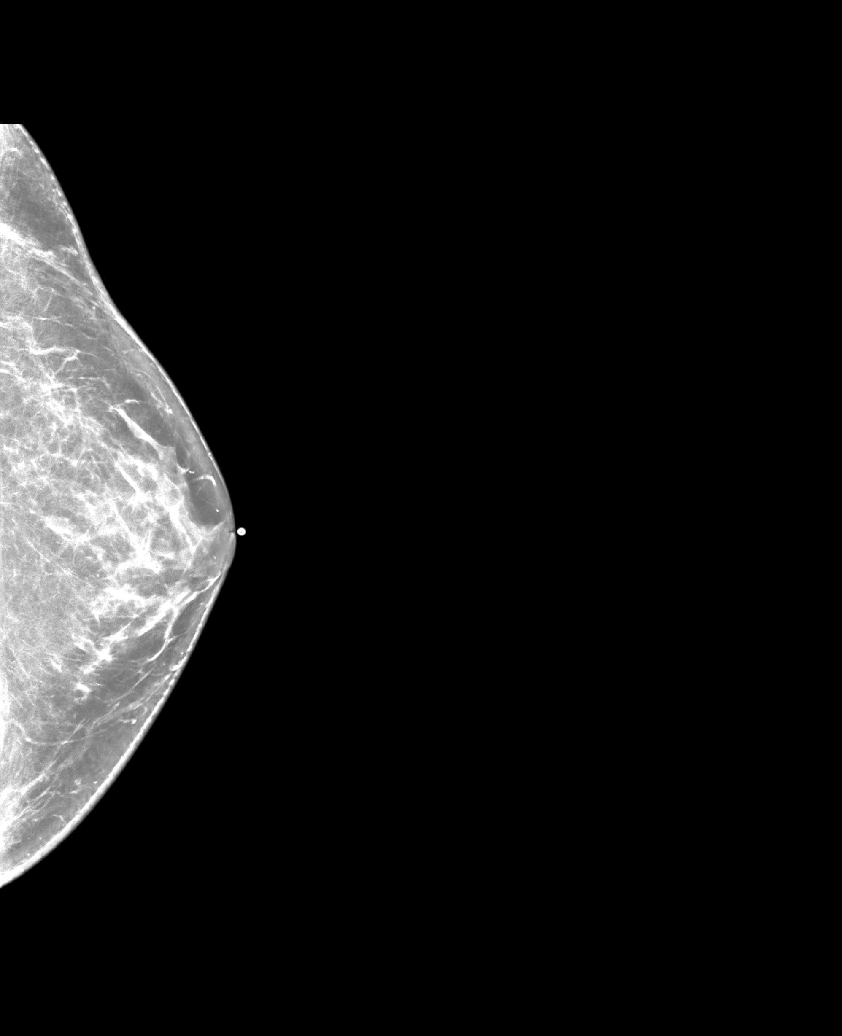

[R MLO synth-2D]
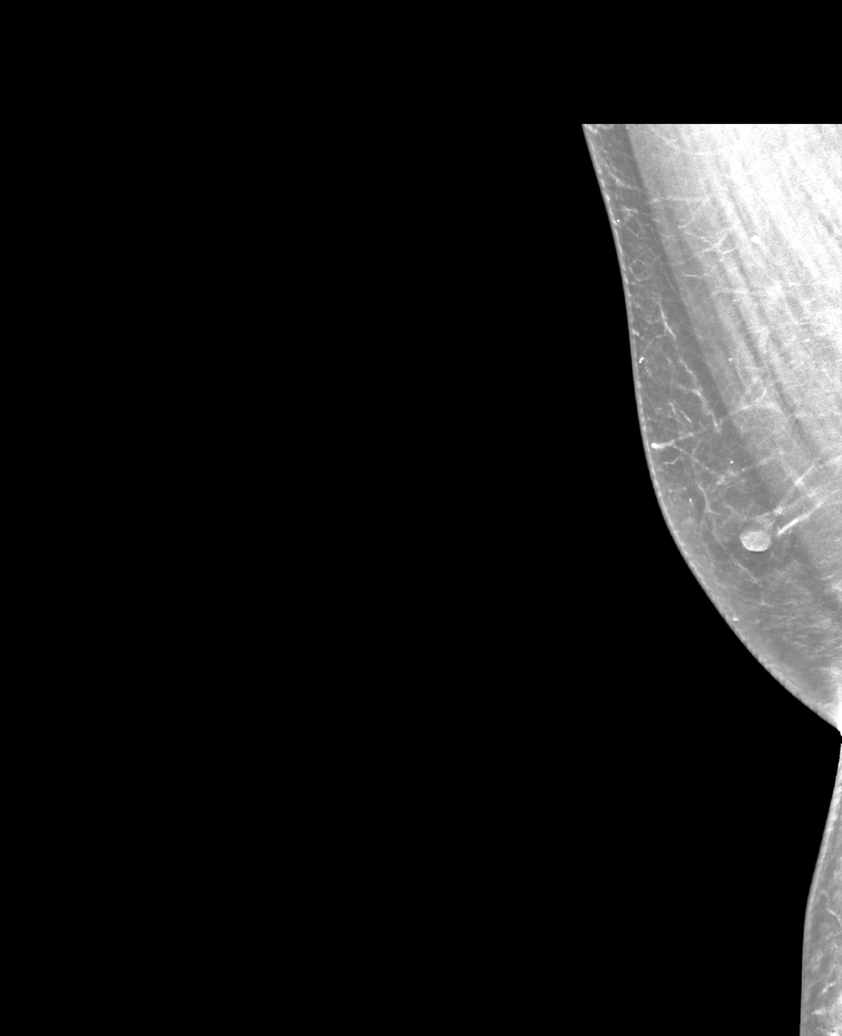

[R CC synth-2D]
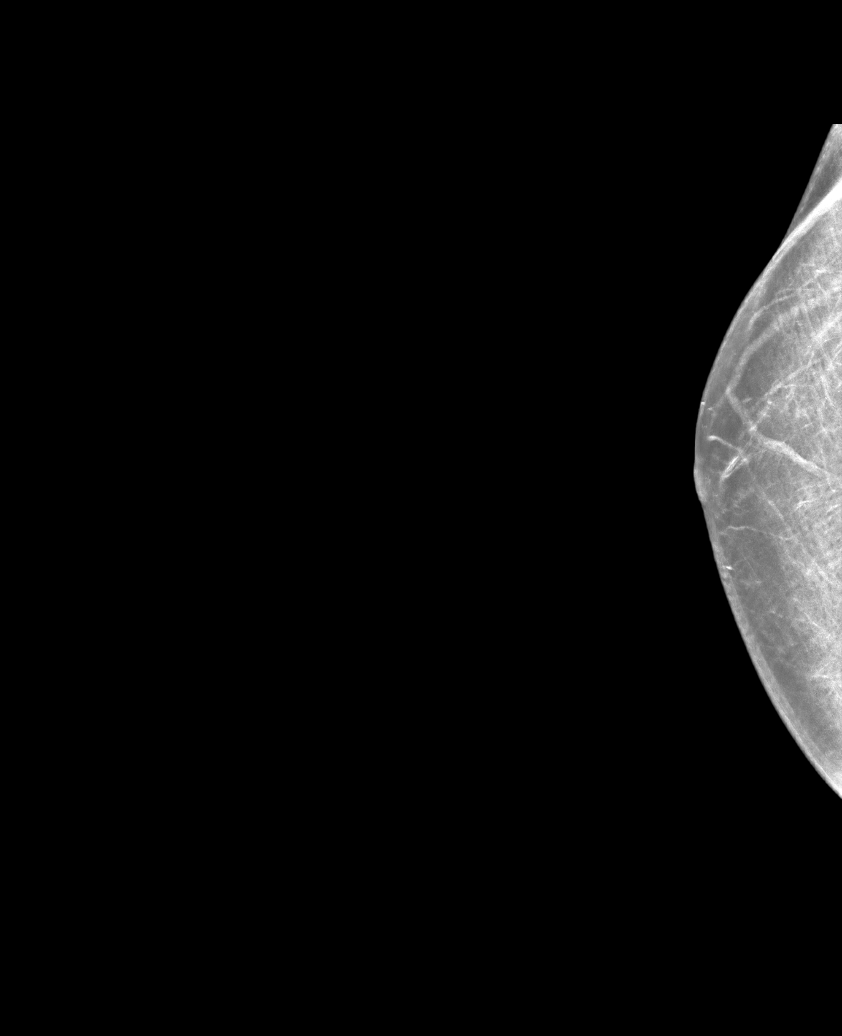

[L TAN synth-2D]
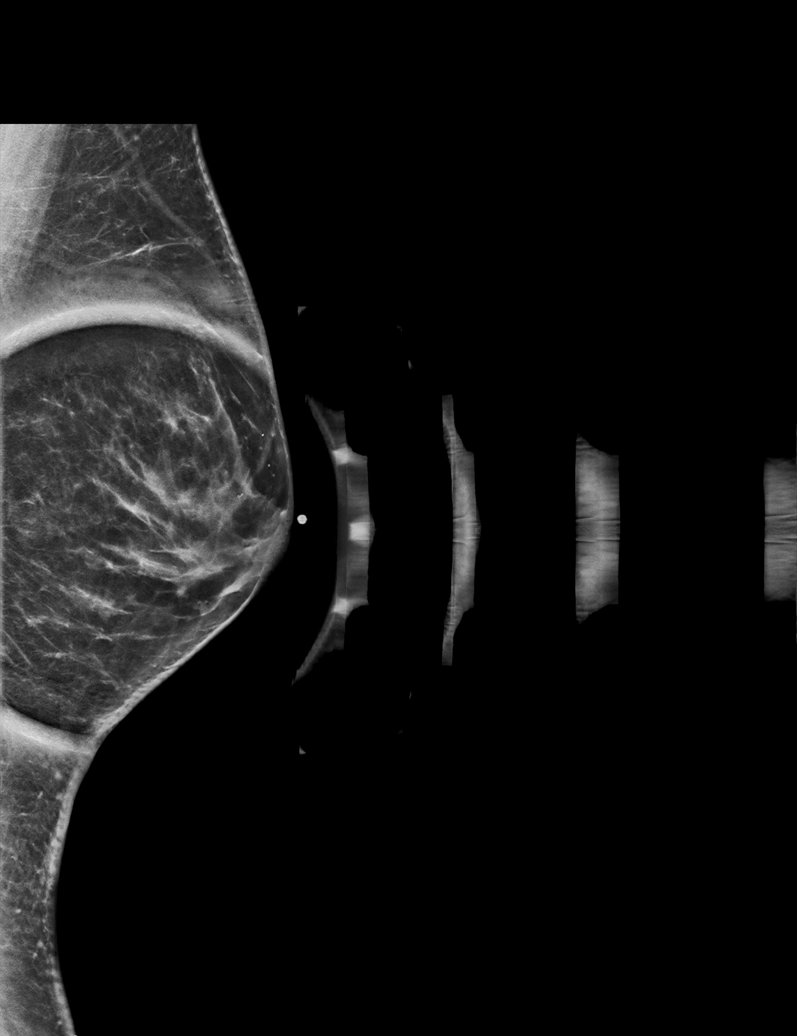

[L MLO synth-2D]
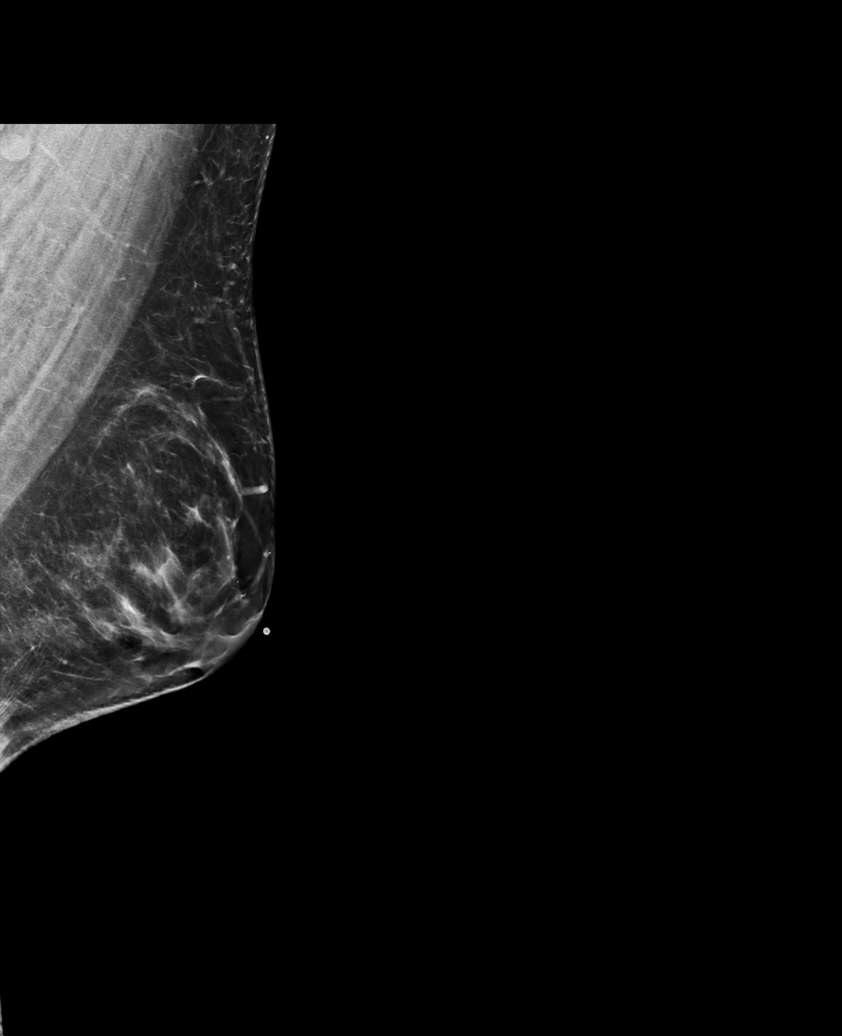

[R MLO tomo · tomo slice 37/73.0]
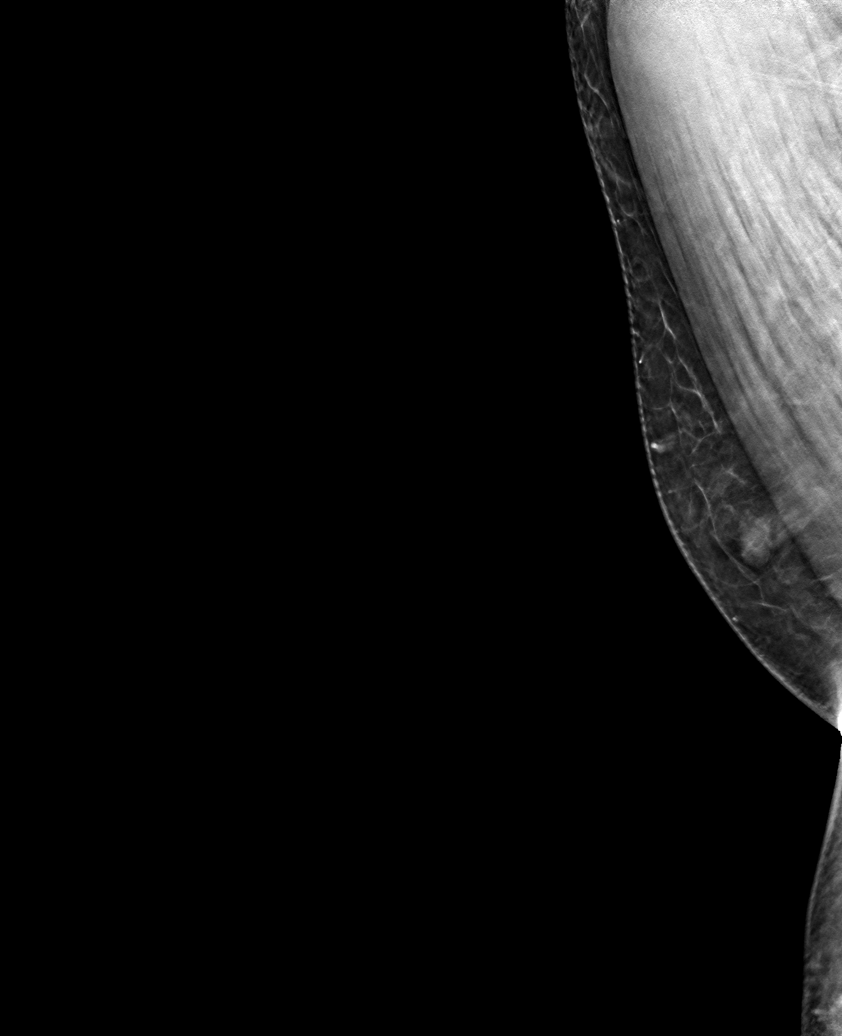

[6 of 30 positions shown; findings below may reference images not displayed]

ACR Breast Density Category b: There are scattered areas of
fibroglandular density.
FINDINGS: No suspicious masses or calcifications seen in either breast. There
is enlargement of the left breast as compared to the right with left
retroareolar flame shaped density most compatible with benign
gynecomastia. There is no mammographic evidence of malignancy in
either breast.

Mammographic images were processed with CAD.
IMPRESSION: Gynecomastia, accounting for the left breast swelling and
tenderness. No findings of malignancy in either breast.

RECOMMENDATION:
Clinical follow-up.

I have discussed the findings and recommendations with the patient.
If applicable, a reminder letter will be sent to the patient
regarding the next appointment.

BI-RADS CATEGORY  2: Benign.

## 2021-08-15 NOTE — Progress Notes (Unsigned)
Synopsis: Referred for FB sensation in throat by [name], Colin Balo, PA  Subjective:   PATIENT ID: Colin [name] GENDER: male DOB: Nov 14, 1965, MRN: [id number]  No chief complaint on file.  17HU with history of hyperthyroid referred for FB sensation in throat - seen by ENT 07/31/21 for same  Swallowed glass in 2009. Seen by ENT with reassuring FNL, felt possibly due to LPR and enlarged thyroid. Rec'd starting omeprazole. Pt requested pulm referral.   Otherwise pertinent review of systems is negative.  No past medical history on file.   Family History  Problem Relation Age of Onset   Thyroid disease Sister    Thyroid disease Mother    Thyroid disease Maternal Grandmother    Asthma Son    Allergies Son      Past Surgical History:  Procedure Laterality Date   CARDIOVERSION N/A 01/17/2021   Procedure: CARDIOVERSION;  Surgeon: Colin Rives, MD;  Location: [facility] ENDOSCOPY;  Service: Cardiovascular;  Laterality: N/A;    Social History   Socioeconomic History   Marital status: Single    Spouse name: Not on file   Number of children: Not on file   Years of education: Not on file   Highest education level: Not on file  Occupational History   Occupation: truck driver  Tobacco Use   Smoking status: Former    Packs/day: 0.50    Years: 10.00    Total pack years: 5.00    Types: Cigarettes    Quit date: 02/07/2013    Years since quitting: 8.5   Smokeless tobacco: Never  Substance and Sexual Activity   Alcohol use: Yes    Alcohol/week: 0.0 standard drinks of alcohol   Drug use: No   Sexual activity: Not on file  Other Topics Concern   Not on file  Social History Narrative   Not on file   Social Determinants of Health   Financial Resource Strain: Not on file  Food Insecurity: Not on file  Transportation Needs: Not on file  Physical Activity: Not on file  Stress: Not on file  Social Connections: Not on file  Intimate Partner Violence: Not on file      Allergies  Allergen Reactions   Ibuprofen Swelling    Swelling in the throat    Shellfish Allergy Swelling     Outpatient Medications Prior to Visit  Medication Sig Dispense Refill   methimazole (TAPAZOLE) 10 MG tablet TAKE 2 TABLETS (20 MG TOTAL) BY MOUTH TWICE DAILY (Patient taking differently: Take 10 mg by mouth See admin instructions. Taking 10 mg five days a week ( skipping Sat & Sun)) 120 tablet 2   metoprolol succinate (TOPROL-XL) 50 MG 24 hr tablet Take 50 mg by mouth daily.     No facility-administered medications prior to visit.       Objective:   Physical Exam:  General appearance: 56 y.o., male, NAD, conversant  Eyes: anicteric sclerae; PERRL, tracking appropriately HENT: NCAT; MMM Neck: Trachea midline; no lymphadenopathy, no JVD Lungs: CTAB, no crackles, no wheeze, with normal respiratory effort CV: RRR, no murmur  Abdomen: Soft, non-tender; non-distended, BS present  Extremities: No peripheral edema, warm Skin: Normal turgor and texture; no rash Psych: Appropriate affect Neuro: Alert and oriented to [name] and place, no focal deficit     There were no vitals filed for this visit.   on *** LPM *** RA BMI Readings from Last 3 Encounters:  05/25/21 37.41 kg/m  01/17/21 36.62 kg/m  12/19/20 36.29 kg/m  Wt Readings from Last 3 Encounters:  05/25/21 275 lb 12.8 oz (125.1 kg)  01/17/21 270 lb (122.5 kg)  12/19/20 267 lb 9.6 oz (121.4 kg)     CBC    Component Value Date/Time   WBC 7.3 12/19/2020 0857   RBC 4.72 12/19/2020 0857   HGB 15.0 01/17/2021 0837   HGB 14.4 12/19/2020 0857   HCT 44.0 01/17/2021 0837   HCT 42.9 12/19/2020 0857   PLT 386 12/19/2020 0857   MCV 91 12/19/2020 0857   MCH 30.5 12/19/2020 0857   MCHC 33.6 12/19/2020 0857   RDW 13.2 12/19/2020 0857    ***  Chest Imaging: ***  Pulmonary Functions Testing Results:     No data to display          FeNO: ***  Pathology: ***  Echocardiogram: ***  Heart  Catheterization: ***    Assessment & Plan:    Plan:      Colin Person, MD [facility] 08/15/2021 6:18 PM

## 2021-08-16 ENCOUNTER — Ambulatory Visit (INDEPENDENT_AMBULATORY_CARE_PROVIDER_SITE_OTHER): Payer: Self-pay | Admitting: Student

## 2021-08-16 ENCOUNTER — Encounter: Payer: Self-pay | Admitting: Student

## 2021-08-16 VITALS — BP 114/68 | HR 75 | Temp 98.1°F | Ht 72.0 in | Wt 280.0 lb

## 2021-08-16 DIAGNOSIS — T17908D Unspecified foreign body in respiratory tract, part unspecified causing other injury, subsequent encounter: Secondary | ICD-10-CM

## 2021-08-16 NOTE — Patient Instructions (Addendum)
-   You will be called to schedule CT Chest, neck. Send me a message through my chart or call to let me know once it's done and we'll talk about next steps which will likely include bronchoscopy

## 2021-08-18 ENCOUNTER — Ambulatory Visit (HOSPITAL_BASED_OUTPATIENT_CLINIC_OR_DEPARTMENT_OTHER)
Admission: RE | Admit: 2021-08-18 | Discharge: 2021-08-18 | Disposition: A | Discharge status: Home / Self Care | Payer: Self-pay | Source: Ambulatory Visit | Attending: Student | Admitting: Student

## 2021-08-18 DIAGNOSIS — T17908D Unspecified foreign body in respiratory tract, part unspecified causing other injury, subsequent encounter: Secondary | ICD-10-CM | POA: Insufficient documentation

## 2021-08-19 ENCOUNTER — Other Ambulatory Visit (HOSPITAL_BASED_OUTPATIENT_CLINIC_OR_DEPARTMENT_OTHER): Payer: Self-pay

## 2021-08-19 ENCOUNTER — Ambulatory Visit (HOSPITAL_BASED_OUTPATIENT_CLINIC_OR_DEPARTMENT_OTHER): Payer: Self-pay

## 2021-09-18 ENCOUNTER — Telehealth: Payer: Self-pay | Admitting: Internal Medicine

## 2021-09-18 NOTE — Telephone Encounter (Signed)
   Pre-operative Risk Assessment    Patient Name: Colin [name]  DOB: 13-Apr-1965 MRN: [id number]      Request for Surgical Clearance    Procedure:   Colonoscopy   Date of Surgery:  Clearance 09/28/21                                 Surgeon:  Dr. Herschell Dimes  Surgeon's Group or Practice Name:  [facility]  Phone number:  [phone] Fax number:  [phone]   Type of Clearance Requested:   - Medical    Type of Anesthesia:   Propofol    Additional requests/questions:    Minna Antis   09/18/2021, 2:17 PM

## 2021-09-19 ENCOUNTER — Telehealth: Payer: Self-pay | Admitting: *Deleted

## 2021-09-19 NOTE — Telephone Encounter (Signed)
   Name: Colin [name]  DOB: 1965/02/26  MRN: [id number]  Primary Cardiologist: Maisie Fus, MD   Preoperative team, please contact this patient and set up a phone call appointment for further preoperative risk assessment. Please obtain consent and complete medication review. Thank you for your help.  I confirm that guidance regarding antiplatelet and oral anticoagulation therapy has been completed and, if necessary, noted below (none requested).    Joylene Grapes, NP 09/19/2021, 12:19 PM [facility]

## 2021-09-19 NOTE — Telephone Encounter (Signed)
Pt agreeable to  plan of care for tele pr op appt 09/20/21 @ 3 pm. 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 He will be back in [facility] Thursday. Pt said to set up tele appt 09/20/21 3 pm. Med rec and consent are done.   I will update the requesting office of the tele visit date and time. Once the pt has been cleared we will fax notes over at that time.

## 2021-09-19 NOTE — Telephone Encounter (Signed)
Pt agreeable to  plan of care for tele pr op appt 09/20/21 @ 3 pm. 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 He will be back in [facility] Thursday. Pt said to set up tele appt 09/20/21 3 pm. Med rec and consent are done.     Patient Consent for Virtual Visit        [name] has provided verbal consent on 09/19/2021 for a virtual visit (video or telephone).   CONSENT FOR VIRTUAL VISIT FOR:  Colin [name]  By participating in this virtual visit I agree to the following:  I hereby voluntarily request, consent and authorize [facility] and its employed or contracted physicians, physician assistants, nurse practitioners or other licensed health care professionals (the Practitioner), to provide me with telemedicine health care services (the "Services") as deemed necessary by the treating Practitioner. I acknowledge and consent to receive the Services by the Practitioner via telemedicine. I understand that the telemedicine visit will involve communicating with the Practitioner through live audiovisual communication technology and the disclosure of certain medical information by electronic transmission. I acknowledge that I have been given the opportunity to request an in-person assessment or other available alternative prior to the telemedicine visit and am voluntarily participating in the telemedicine visit.  I understand that I have the right to withhold or withdraw my consent to the use of telemedicine in the course of my care at any time, without affecting my right to future care or treatment, and that the Practitioner or I may terminate the telemedicine visit at any time. I understand that I have the right to inspect all information obtained and/or recorded in the course of the telemedicine visit and may receive copies of available information for a reasonable fee.  I understand that some of the potential risks of receiving the Services via  telemedicine include:  Delay or interruption in medical evaluation due to technological equipment failure or disruption; Information transmitted may not be sufficient (e.g. poor resolution of images) to allow for appropriate medical decision making by the Practitioner; and/or  In rare instances, security protocols could fail, causing a breach of personal health information.  Furthermore, I acknowledge that it is my responsibility to provide information about my medical history, conditions and care that is complete and accurate to the best of my ability. I acknowledge that Practitioner's advice, recommendations, and/or decision may be based on factors not within their control, such as incomplete or inaccurate data provided by me or distortions of diagnostic images or specimens that may result from electronic transmissions. I understand that the practice of medicine is not an exact science and that Practitioner makes no warranties or guarantees regarding treatment outcomes. I acknowledge that a copy of this consent can be made available to me via my patient portal [facility] MyChart), or I can request a printed copy by calling the office of [facility].    I understand that my insurance will be billed for this visit.   I have read or had this consent read to me. I understand the contents of this consent, which adequately explains the benefits and risks of the Services being provided via telemedicine.  I have been provided ample opportunity to ask questions regarding this consent and the Services and have had my questions answered to my satisfaction. I give my informed consent for the services to be provided through the use of telemedicine in my medical care

## 2021-09-27 ENCOUNTER — Ambulatory Visit: Payer: Self-pay | Attending: Cardiovascular Disease | Admitting: Physician Assistant

## 2021-09-27 DIAGNOSIS — Z01810 Encounter for preprocedural cardiovascular examination: Secondary | ICD-10-CM

## 2021-09-27 NOTE — Progress Notes (Signed)
Virtual Visit via Telephone Note   Because of Colin [name]'s co-morbid illnesses, he is at least at moderate risk for complications without adequate follow up.  This format is felt to be most appropriate for this patient at this time.  The patient did not have access to video technology/had technical difficulties with video requiring transitioning to audio format only (telephone).  All issues noted in this document were discussed and addressed.  No physical exam could be performed with this format.  Please refer to the patient's chart for his consent to telehealth for [facility].  Evaluation Performed:  Preoperative cardiovascular risk assessment _____________   Date:  09/27/2021   Patient ID:  Colin [name], DOB February 17, 1965, MRN [id number] Patient Location:  Home Provider location:   Office  Primary Care Provider:  Marda Stalker, PA-C Primary Cardiologist:  Janina Mayo, MD  Chief Complaint / Patient Profile   56 y.o. y/o male with a h/o GERD, hyperthyroidism, atrial fibrillation who is pending colonoscopy and presents today for telephonic preoperative cardiovascular risk assessment.  Past Medical History    No past medical history on file. Past Surgical History:  Procedure Laterality Date   CARDIOVERSION N/A 01/17/2021   Procedure: CARDIOVERSION;  Surgeon: Geralynn Rile, MD;  Location: [place];  Service: Cardiovascular;  Laterality: N/A;    Allergies  Allergies  Allergen Reactions   Ibuprofen Swelling    Swelling in the throat    Shellfish Allergy Swelling    History of Present Illness    Colin [name] is a 56 y.o. male who presents via audio/video conferencing for a telehealth visit today.  Pt was last seen in cardiology clinic on 05/25/21 by Dr. Harl Bowie.  At that time Colin [name] was doing well.  The patient is now pending procedure as outlined above. Since his last visit, he tells me that he does not have much fatigue and  overall is tolerating a lower dose of his thyroid medicine.  He denies chest pain, shortness of breath, and dizziness/lightheadedness.  He states that he has no issues with walking and even running.  He takes stairs all the time because he lives in a two-story home.  He enjoys working outside and washing his cars.  For this reason he is scored a 6.77 METS on the DASI.  This exceeds the 4 METS minimum requirement.  No medications are indicated as needing held.  Home Medications    Prior to Admission medications   Medication Sig Start Date End Date Taking? Authorizing Provider  methimazole (TAPAZOLE) 10 MG tablet TAKE 2 TABLETS (20 MG TOTAL) BY MOUTH TWICE DAILY Patient taking differently: Take 10 mg by mouth See admin instructions. Taking 10 mg five days a week ( skipping Sat & Sun) 09/24/16   Renato Shin, MD  sildenafil (REVATIO) 20 MG tablet As needed    [provider]    Physical Exam    Vital Signs:  Colin [name] does not have vital signs available for review today.  Given telephonic nature of communication, physical exam is limited. AAOx3. NAD. Normal affect.  Speech and respirations are unlabored.  Accessory Clinical Findings    None  Assessment & Plan    1.  Preoperative Cardiovascular Risk Assessment:   Colin [name] perioperative risk of a major cardiac event is 0.4% according to the Revised Cardiac Risk Index (RCRI).  Therefore, he is at low risk for perioperative complications.   His functional capacity is good at 6.77 METs according to the [facility]  Activity Status Index (DASI). Recommendations: According to ACC/AHA guidelines, no further cardiovascular testing needed.  The patient may proceed to surgery at acceptable risk.    A copy of this note will be routed to requesting surgeon.  Time:   Today, I have spent 7 minutes with the patient with telehealth technology discussing medical history, symptoms, and management plan.     Sharlene Dory,  PA-C  09/27/2021, 2:51 PM

## 2021-10-03 ENCOUNTER — Ambulatory Visit: Payer: Self-pay | Admitting: Student

## 2021-10-18 NOTE — Progress Notes (Signed)
Synopsis: Referred for FB sensation in throat by Marda Stalker, PA-C  Subjective:   PATIENT ID: Colin [name] GENDER: male DOB: November 20, 1965, MRN: [id number]  Chief Complaint  Patient presents with   Follow-up    Pt states he feels like he has some glass in his throat from 2009-2010. He thinks it in his lungs and going undetected. It bothers him when laying flat or on his back.    47NI with history of hyperthyroid referred for FB sensation in throat - seen by ENT 07/31/21 for same  Swallowed glass in 2009. Feels like something moving up and down as he breathes in his windpipe especially when lying down. Seen by ENT with reassuring FNL, felt possibly due to LPR and enlarged thyroid. Rec'd starting omeprazole. Pt requested pulm referral. He has occasional cough. Occasional hemoptysis.   He doesn't think he's had a CT scan of his chest.   He has no family history of lung disease  Interval HPI: Still feels like there's something in his airway. We reviewed his CT Chest and neck without any clear evidence of aspirated foreign body. No CP, occasional cough.  Otherwise pertinent review of systems is negative.  No past medical history on file.   Family History  Problem Relation Age of Onset   Thyroid disease Sister    Thyroid disease Mother    Thyroid disease Maternal Grandmother    Asthma Son    Allergies Son      Past Surgical History:  Procedure Laterality Date   CARDIOVERSION N/A 01/17/2021   Procedure: CARDIOVERSION;  Surgeon: Geralynn Rile, MD;  Location: [facility];  Service: Cardiovascular;  Laterality: N/A;    Social History   Socioeconomic History   Marital status: Single    Spouse name: Not on file   Number of children: Not on file   Years of education: Not on file   Highest education level: Not on file  Occupational History   Occupation: truck driver  Tobacco Use   Smoking status: Former    Packs/day: 0.25    Years: 37.00    Total pack years:  9.25    Types: Cigarettes    Quit date: 02/13/2021    Years since quitting: 0.6   Smokeless tobacco: Never  Vaping Use   Vaping Use: Never used  Substance and Sexual Activity   Alcohol use: Yes    Alcohol/week: 0.0 standard drinks of alcohol   Drug use: No   Sexual activity: Not on file  Other Topics Concern   Not on file  Social History Narrative   Not on file   Social Determinants of Health   Financial Resource Strain: Not on file  Food Insecurity: Not on file  Transportation Needs: Not on file  Physical Activity: Not on file  Stress: Not on file  Social Connections: Not on file  Intimate Partner Violence: Not on file     Allergies  Allergen Reactions   Ibuprofen Swelling    Swelling in the throat    Shellfish Allergy Swelling     Outpatient Medications Prior to Visit  Medication Sig Dispense Refill   methimazole (TAPAZOLE) 10 MG tablet TAKE 2 TABLETS (20 MG TOTAL) BY MOUTH TWICE DAILY (Patient taking differently: Take 10 mg by mouth See admin instructions. Taking 10 mg five days a week ( skipping Sat & Sun)) 120 tablet 2   sildenafil (REVATIO) 20 MG tablet As needed     No facility-administered medications prior to visit.  Objective:   Physical Exam:  General appearance: 56 y.o., male, NAD, conversant  Eyes: anicteric sclerae; PERRL, tracking appropriately HENT: NCAT; MMM Neck: Trachea midline; no lymphadenopathy, no JVD Lungs: CTAB, no crackles, no wheeze, with normal respiratory effort CV: RRR, no murmur  Abdomen: Soft, non-tender; non-distended, BS present  Extremities: No peripheral edema, warm Skin: Normal turgor and texture; no rash Psych: Appropriate affect Neuro: Alert and oriented to person and place, no focal deficit     Vitals:   10/19/21 1556  BP: 118/64  Pulse: 79  Temp: 98.1 F (36.7 C)  TempSrc: Oral  SpO2: 96%  Weight: 278 lb (126.1 kg)  Height: 6' (1.829 m)    96% on RA BMI Readings from Last 3 Encounters:  10/19/21  37.70 kg/m  08/16/21 37.97 kg/m  05/25/21 37.41 kg/m   Wt Readings from Last 3 Encounters:  10/19/21 278 lb (126.1 kg)  08/16/21 280 lb (127 kg)  05/25/21 275 lb 12.8 oz (125.1 kg)     CBC    Component Value Date/Time   WBC 7.3 12/19/2020 0857   RBC 4.72 12/19/2020 0857   HGB 15.0 01/17/2021 0837   HGB 14.4 12/19/2020 0857   HCT 44.0 01/17/2021 0837   HCT 42.9 12/19/2020 0857   PLT 386 12/19/2020 0857   MCV 91 12/19/2020 0857   MCH 30.5 12/19/2020 0857   MCHC 33.6 12/19/2020 0857   RDW 13.2 12/19/2020 0857    Chest Imaging: CXR report 2009 reviewed by me - Discoid atelectasis and/or scarring is identified within the  costophrenic angle region on the LEFT  CT Chest, neck 08/20/21 reviewed by me unremarkable  Pulmonary Functions Testing Results:     No data to display              Assessment & Plan:   # Foreign body sensation, aspiration  Plan: - bronch for airway inspection under moderate in January      Omar Person, MD [facility] 10/19/2021 4:29 PM

## 2021-10-19 ENCOUNTER — Encounter: Payer: Self-pay | Admitting: Student

## 2021-10-19 ENCOUNTER — Ambulatory Visit (INDEPENDENT_AMBULATORY_CARE_PROVIDER_SITE_OTHER): Payer: Self-pay | Admitting: Student

## 2021-10-19 VITALS — BP 118/64 | HR 79 | Temp 98.1°F | Ht 72.0 in | Wt 278.0 lb

## 2021-10-19 DIAGNOSIS — T17908D Unspecified foreign body in respiratory tract, part unspecified causing other injury, subsequent encounter: Secondary | ICD-10-CM

## 2021-10-19 NOTE — Patient Instructions (Addendum)
- We will arrange bronchoscopy to take a look and see if there's any debris from glass, etc in your airway in early January - Nothing to eat or drink after midnight the night before procedure, need ride home on day of procedure - See you for an appointment in late January to discuss bronch results   Flexible Bronchoscopy  Flexible bronchoscopy is a procedure used to examine the passageways in the lungs. During the procedure, a thin, flexible tool with a camera (bronchoscope) is passed into the mouth or nose, down through the windpipe (trachea), and into the air tubes in the lungs (bronchi). This tool allows the health care provider to look inside the lungs and to take samples for testing, if needed. Tell a health care provider about: Any allergies you have. All medicines you are taking, including vitamins, herbs, eye drops, creams, and over-the-counter medicines. Any problems you or family members have had with anesthetic medicines. Any bleeding problems you have. Any surgeries you have had. Any medical conditions you have. Whether you are pregnant or may be pregnant. What are the risks? Your healthcare provider will talk with you about risks. These may include: Infection. Bleeding. Damage to other structures or organs. Allergic reactions to medicines. Collapsed lung (pneumothorax). Increased need for oxygen or difficulty breathing after the procedure. What happens before the procedure? When to stop eating and drinking Follow instructions from your health care provider about what you may eat and drink. These may include: 8 hours before your procedure Stop eating most foods. Do not eat meat, fried foods, or fatty foods. Eat only light foods, such as toast or crackers. All liquids are okay except energy drinks and alcohol. 6 hours before your procedure Stop eating. Drink only clear liquids, such as water, clear fruit juice, black coffee, plain tea, and sports drinks. Do not drink  energy drinks or alcohol. 2 hours before your procedure Stop drinking all liquids. You may be allowed to take medicines with small sips of water. If you do not follow your health care provider's instructions, your procedure may be delayed or canceled. Medicines Ask your health care provider about: Changing or stopping your regular medicines. These include any diabetes medicines or blood thinners you take. Taking medicines such as aspirin and ibuprofen. These medicines can thin your blood. Do not take them unless your health care provider tells you to. Taking over-the-counter medicines, vitamins, herbs, and supplements. General instructions You may be given antibiotic medicine to help lower the risk of infection. If you will be going home right after the procedure, plan to have a responsible adult: Take you home from the hospital or clinic. You will not be allowed to drive. Care for you for the time you are told. What happens during the procedure? An IV will be inserted into one of your veins. You will be given a medicine (local anesthetic) to numb your mouth, nose, throat, and voice box (larynx). You may also be given one or more of the following: A medicine to help you relax (sedative). A medicine to control coughing. A medicine to dry up any fluids or secretions in your lungs. A bronchoscope will be passed into your nose or mouth, and into your lungs. Your health care provider will examine your lungs. Samples of airway secretions may be collected for testing. If abnormal areas are seen in your airways, samples of tissue may be removed and checked under a microscope (biopsy). If tissue samples are needed from the outer parts of the lung,  a type of X-ray (fluoroscopy) may be used to guide the bronchoscope to these areas. If bleeding occurs, you may be given medicine to stop or decrease the bleeding. The procedure may vary among health care providers and hospitals. What happens after the  procedure? Your blood pressure, heart rate, breathing rate, and blood oxygen level will be monitored until you leave the hospital or clinic. You may have a chest X-ray to check for signs of pneumothorax. You willnot be allowed to eat or drink anything for 2 hours after your procedure. If a biopsy was taken, it is up to you to get the results of the test. Ask your health care provider, or the department that is doing the procedure, when your results will be ready. Contact a health care provider if: You have a fever. Get help right away if: You have shortness of breath that gets worse. You get light-headed or feel like you might faint. You have chest pain. You cough up more than a small amount of blood. These symptoms may be an emergency. Get help right away. Call 911. Do not wait to see if the symptoms will go away. Do not drive yourself to the hospital. Summary Flexible bronchoscopy is a procedure that allows your health care provider to look closely inside your lungs and to take testing samples if needed. Risks of flexible bronchoscopy include bleeding, infection, and collapsed lung (pneumothorax). Before the procedure, you will be given a medicine to numb your mouth, nose, throat, and voice box. Then, a bronchoscope will be passed into your nose or mouth, and into your lungs. After the procedure, your blood pressure, heart rate, breathing rate, and blood oxygen level will be monitored until you leave the hospital or clinic. You may have a chest X-ray to check for signs of pneumothorax. This information is not intended to replace advice given to you by your health care provider. Make sure you discuss any questions you have with your health care provider. Document Revised: 04/03/2021 Document Reviewed: 04/03/2021 Elsevier Patient Education  [place].

## 2021-11-12 ENCOUNTER — Ambulatory Visit: Payer: Self-pay | Admitting: Internal Medicine

## 2022-03-21 ENCOUNTER — Other Ambulatory Visit (HOSPITAL_COMMUNITY): Payer: Self-pay | Admitting: Nurse Practitioner

## 2022-03-21 DIAGNOSIS — E0590 Thyrotoxicosis, unspecified without thyrotoxic crisis or storm: Secondary | ICD-10-CM

## 2022-05-21 ENCOUNTER — Encounter (HOSPITAL_COMMUNITY)
Admission: RE | Admit: 2022-05-21 | Discharge: 2022-05-21 | Disposition: A | Discharge status: Home / Self Care | Payer: Self-pay | Source: Ambulatory Visit | Attending: Nurse Practitioner | Admitting: Nurse Practitioner

## 2022-05-21 DIAGNOSIS — E0590 Thyrotoxicosis, unspecified without thyrotoxic crisis or storm: Secondary | ICD-10-CM | POA: Insufficient documentation

## 2022-05-21 MED ORDER — SODIUM IODIDE I-123 7.4 MBQ CAPS
422.0000 | ORAL_CAPSULE | Freq: Once | ORAL | Status: AC
  Administered 2022-05-21: 422 via ORAL

## 2022-05-22 ENCOUNTER — Encounter (HOSPITAL_COMMUNITY)
Admission: RE | Admit: 2022-05-22 | Discharge: 2022-05-22 | Disposition: A | Discharge status: Home / Self Care | Payer: Self-pay | Source: Ambulatory Visit | Attending: Nurse Practitioner | Admitting: Nurse Practitioner

## 2022-05-22 MED ORDER — TECHNETIUM TC 99M MEDRONATE IV KIT: 20.0000 | PACK | Freq: Once | INTRAVENOUS | Status: DC | PRN

## 2022-10-11 ENCOUNTER — Other Ambulatory Visit: Payer: Self-pay | Admitting: Nurse Practitioner

## 2022-10-11 DIAGNOSIS — E0590 Thyrotoxicosis, unspecified without thyrotoxic crisis or storm: Secondary | ICD-10-CM

## 2022-10-18 NOTE — Written Directive (Addendum)
 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 )   PRESCRIBED DOSE FOR ADMINISTRATION: 18 mCi   ROUTE OFADMINISTRATION: PO   DIAGNOSIS:  Hyperthyroidism  E05.90  REFERRING PHYSICIAN:Jessica CHRISTELLA Bill FNP   TSH:    Lab Results  Component Value Date   TSH 0.07 (L) 05/05/2015   TSH 36.71 (H) 02/27/2015   TSH 42.715 (H) 01/27/2015     PRIOR I-131 THERAPY (Date and Dose):   PRIOR RADIOLOGY EXAMS (Results and Date): NM THYROID  MULT UPTAKE W/IMAGING  Result Date: 05/22/2022 CLINICAL DATA:  Hyperthyroidism EXAM: THYROID  SCAN AND UPTAKE - 4 AND 24 HOURS TECHNIQUE: Following oral administration of I-123 capsule, anterior planar imaging was acquired at 24 hours. Thyroid  uptake was calculated with a thyroid  probe at 4-6 hours and 24 hours. RADIOPHARMACEUTICALS:  Four hundred twenty-two uCi I-123 sodium iodide p.o. COMPARISON:  None Available. FINDINGS: Homogeneous site uptake throughout the thyroid . 4 hour I-123 uptake = 39.4% (normal 5-20%) 24 hour I-123 uptake = 52.4% (normal 10-30%) IMPRESSION: No nodules or masses identified. Elevated iodine uptake in the thyroid  at both 4 and 24 hours. Electronically Signed   By: Alm Pouch III M.D.   On: 05/22/2022 13:09   US  THYROID   Result Date: 02/26/2021 CLINICAL DATA:  Thyromegaly. EXAM: THYROID  ULTRASOUND TECHNIQUE: Ultrasound examination of the thyroid  gland and adjacent soft tissues was performed. COMPARISON:  None. FINDINGS:  Parenchymal Echotexture: Moderately heterogenous Isthmus: 1.3 cm Right lobe: 8.8 x 3.4 x 3.6 cm Left lobe: 9.6 x 3.5 x 4.2 cm _________________________________________________________ Estimated total number of nodules >/= 1 cm: 0 Number of spongiform nodules >/=  2 cm not described below (TR1): 0 Number of mixed cystic and solid nodules >/= 1.5 cm not described below (TR2): 0 _________________________________________________________ No discrete nodules are seen within the thyroid  gland. No cervical adenopathy or abnormal fluid collection within the imaged neck. IMPRESSION: Enlarged thyroid  gland, without discrete nodule. Consider correlation with thyroid  enzymatic function, if not already performed. Thom Hall, MD [facility] Electronically Signed   By: Thom Hall M.D.   On: 02/26/2021 11:21      ADDITIONAL PHYSICIAN COMMENTS/NOTES Graves disease, wt loss, tremors, depressed TSH.. high iodine uptake  AUTHORIZED USER SIGNATURE & TIME STAMP: Norleen GORMAN Boxer, MD   10/21/22    11:23 AM

## 2023-01-21 ENCOUNTER — Encounter
Admission: RE | Admit: 2023-01-21 | Discharge: 2023-01-21 | Disposition: A | Discharge status: Home / Self Care | Payer: Self-pay | Source: Ambulatory Visit | Attending: Nurse Practitioner | Admitting: Nurse Practitioner

## 2023-01-21 DIAGNOSIS — E0590 Thyrotoxicosis, unspecified without thyrotoxic crisis or storm: Secondary | ICD-10-CM | POA: Insufficient documentation

## 2023-01-21 MED ORDER — SODIUM IODIDE I 131 CAPSULE
18.0000 | Freq: Once | INTRAVENOUS | Status: AC | PRN
  Administered 2023-01-21: 18.59 via ORAL
